# Patient Record
Sex: Female | Born: 1935 | Race: White | Hispanic: No | Marital: Single | Smoking: Former smoker
Health system: Southern US, Community
[De-identification: ages and names within clinical notes are randomized; demographics above are authoritative.]

## PROBLEM LIST (undated history)

## (undated) DIAGNOSIS — J439 Emphysema, unspecified: Secondary | ICD-10-CM

## (undated) DIAGNOSIS — R0989 Other specified symptoms and signs involving the circulatory and respiratory systems: Secondary | ICD-10-CM

## (undated) DIAGNOSIS — M1990 Unspecified osteoarthritis, unspecified site: Secondary | ICD-10-CM

## (undated) DIAGNOSIS — J45909 Unspecified asthma, uncomplicated: Secondary | ICD-10-CM

## (undated) DIAGNOSIS — R233 Spontaneous ecchymoses: Secondary | ICD-10-CM

## (undated) DIAGNOSIS — K279 Peptic ulcer, site unspecified, unspecified as acute or chronic, without hemorrhage or perforation: Secondary | ICD-10-CM

## (undated) DIAGNOSIS — J449 Chronic obstructive pulmonary disease, unspecified: Secondary | ICD-10-CM

## (undated) DIAGNOSIS — J349 Unspecified disorder of nose and nasal sinuses: Secondary | ICD-10-CM

## (undated) DIAGNOSIS — H9190 Unspecified hearing loss, unspecified ear: Secondary | ICD-10-CM

## (undated) DIAGNOSIS — Z87448 Personal history of other diseases of urinary system: Secondary | ICD-10-CM

## (undated) DIAGNOSIS — T8859XA Other complications of anesthesia, initial encounter: Secondary | ICD-10-CM

## (undated) DIAGNOSIS — T4145XA Adverse effect of unspecified anesthetic, initial encounter: Secondary | ICD-10-CM

## (undated) DIAGNOSIS — R238 Other skin changes: Secondary | ICD-10-CM

## (undated) DIAGNOSIS — K219 Gastro-esophageal reflux disease without esophagitis: Secondary | ICD-10-CM

## (undated) DIAGNOSIS — I872 Venous insufficiency (chronic) (peripheral): Secondary | ICD-10-CM

## (undated) DIAGNOSIS — H409 Unspecified glaucoma: Secondary | ICD-10-CM

## (undated) HISTORY — PX: OTHER SURGICAL HISTORY: SHX169

## (undated) HISTORY — DX: Unspecified osteoarthritis, unspecified site: M19.90

## (undated) HISTORY — PX: ABDOMINAL HYSTERECTOMY: SHX81

## (undated) HISTORY — PX: SURGERY OF LIP: SUR1315

## (undated) HISTORY — DX: Unspecified disorder of nose and nasal sinuses: J34.9

## (undated) HISTORY — DX: Personal history of other diseases of urinary system: Z87.448

## (undated) HISTORY — DX: Chronic obstructive pulmonary disease, unspecified: J44.9

## (undated) HISTORY — DX: Unspecified hearing loss, unspecified ear: H91.90

## (undated) HISTORY — DX: Spontaneous ecchymoses: R23.3

## (undated) HISTORY — DX: Emphysema, unspecified: J43.9

## (undated) HISTORY — DX: Unspecified asthma, uncomplicated: J45.909

## (undated) HISTORY — DX: Other skin changes: R23.8

## (undated) HISTORY — DX: Other specified symptoms and signs involving the circulatory and respiratory systems: R09.89

## (undated) HISTORY — DX: Unspecified glaucoma: H40.9

---

## 2011-01-11 ENCOUNTER — Ambulatory Visit: Payer: Self-pay | Admitting: Internal Medicine

## 2011-02-15 ENCOUNTER — Ambulatory Visit: Payer: Self-pay | Admitting: Internal Medicine

## 2012-02-26 ENCOUNTER — Ambulatory Visit: Payer: Self-pay | Admitting: Internal Medicine

## 2012-10-21 ENCOUNTER — Ambulatory Visit: Payer: Self-pay | Admitting: Medical

## 2013-06-19 ENCOUNTER — Ambulatory Visit: Payer: Self-pay | Admitting: Family Medicine

## 2014-01-07 ENCOUNTER — Ambulatory Visit: Payer: Self-pay | Admitting: Physician Assistant

## 2014-01-17 ENCOUNTER — Encounter: Payer: Self-pay | Admitting: Podiatry

## 2014-01-18 ENCOUNTER — Encounter: Payer: Self-pay | Admitting: Podiatry

## 2014-01-18 ENCOUNTER — Ambulatory Visit (INDEPENDENT_AMBULATORY_CARE_PROVIDER_SITE_OTHER): Payer: Medicare Other

## 2014-01-18 ENCOUNTER — Ambulatory Visit (INDEPENDENT_AMBULATORY_CARE_PROVIDER_SITE_OTHER): Payer: Medicare Other | Admitting: Podiatry

## 2014-01-18 VITALS — BP 117/61 | HR 76 | Resp 16 | Ht 61.0 in | Wt 112.0 lb

## 2014-01-18 DIAGNOSIS — M79609 Pain in unspecified limb: Secondary | ICD-10-CM

## 2014-01-18 DIAGNOSIS — M79673 Pain in unspecified foot: Secondary | ICD-10-CM

## 2014-01-18 DIAGNOSIS — M722 Plantar fascial fibromatosis: Secondary | ICD-10-CM

## 2014-01-18 DIAGNOSIS — D492 Neoplasm of unspecified behavior of bone, soft tissue, and skin: Secondary | ICD-10-CM

## 2014-01-18 MED ORDER — TRIAMCINOLONE ACETONIDE 10 MG/ML IJ SUSP
10.0000 mg | Freq: Once | INTRAMUSCULAR | Status: AC
  Administered 2014-01-18: 10 mg

## 2014-01-20 NOTE — Progress Notes (Signed)
Subjective:     Patient ID: Kimberly Villarreal, female   DOB: Aug 20, 1920, 80 y.o.   MRN: [id number]  Foot Pain   patient points to the bottom of the left foot stating that she's had a knot on the arch that has been present for over a month and has been quite sore. Feels it's a little bit better the last week but still tender when pressed   Review of Systems  All other systems reviewed and are negative.       Objective:   Physical Exam  Nursing note and vitals reviewed. Constitutional: She is oriented to person, place, and time.  Cardiovascular: Intact distal pulses.   Musculoskeletal: Normal range of motion.  Neurological: She is oriented to person, place, and time.  Skin: Skin is warm.   neurovascular status intact with muscle strength adequate and no equinus condition noted. Small mass noted in the mid arch area left that is palpable and is painful when pressed with no indication of deep extension or changes on the skin     Assessment:     Probable cyst or possible plantar fasciitis with inflammation and fibroma plantar aspect left heel    Plan:     H&P and x-ray reviewed. Patient had numerous questions and we reviewed these questions and today I did a careful injection from the lateral side to try to reduce inflammation and possibly shrink the small mass. Patient will also use warm compresses and return to me if it should not improve grow in size or stay painful

## 2014-02-01 ENCOUNTER — Ambulatory Visit (INDEPENDENT_AMBULATORY_CARE_PROVIDER_SITE_OTHER): Payer: Medicare Other | Admitting: Podiatry

## 2014-02-01 VITALS — BP 117/67 | HR 79 | Resp 16 | Ht 61.0 in | Wt 112.0 lb

## 2014-02-01 DIAGNOSIS — D492 Neoplasm of unspecified behavior of bone, soft tissue, and skin: Secondary | ICD-10-CM

## 2014-02-01 NOTE — Progress Notes (Signed)
Subjective:     Patient ID: Kimberly Villarreal, female   DOB: 1920-07-27, 80 y.o.   MRN: [id number]  HPI patient presents stating it is quite a bit smaller than it was I wanted you to check bad and also my nail  Review of Systems     Objective:   Physical Exam Neurovascular status unchanged with a small nodule plantar left arch which has shrunk in size and is no longer tender after injection of several weeks ago. Nail shows slight discoloration    Assessment:     Nodule that is shrinking and mycotic nail infection    Plan:     Reviewed conditions and advised on continued soaks and topical antifungal as needed. Reappoint her recheck

## 2014-10-23 DIAGNOSIS — M7050 Other bursitis of knee, unspecified knee: Secondary | ICD-10-CM | POA: Insufficient documentation

## 2015-03-14 ENCOUNTER — Ambulatory Visit
Admit: 2015-03-14 | Disposition: A | Discharge status: Home / Self Care | Payer: Self-pay | Attending: Family Medicine | Admitting: Family Medicine

## 2015-04-08 DIAGNOSIS — I872 Venous insufficiency (chronic) (peripheral): Secondary | ICD-10-CM | POA: Insufficient documentation

## 2015-04-08 DIAGNOSIS — I878 Other specified disorders of veins: Secondary | ICD-10-CM | POA: Insufficient documentation

## 2015-05-06 ENCOUNTER — Encounter: Payer: Self-pay | Admitting: Internal Medicine

## 2015-05-06 ENCOUNTER — Ambulatory Visit (INDEPENDENT_AMBULATORY_CARE_PROVIDER_SITE_OTHER): Payer: Medicare Other | Admitting: Internal Medicine

## 2015-05-06 VITALS — BP 124/64 | HR 70 | Temp 97.5°F | Ht 61.0 in | Wt 103.6 lb

## 2015-05-06 DIAGNOSIS — J432 Centrilobular emphysema: Secondary | ICD-10-CM

## 2015-05-06 DIAGNOSIS — J439 Emphysema, unspecified: Secondary | ICD-10-CM | POA: Insufficient documentation

## 2015-05-06 NOTE — Assessment & Plan Note (Addendum)
Patient overall is in very stable condition. I believe that had 2 bronchitis episodes in the last 8 months of didn't primarily due to close proximity with sick contacts, which includes her daughter. Her COPD overall is mild. Pulmonary function testing at this time is not recommended, I do not believe the patient will be compliant for the pulmonary function visit or following the appropriate procedures for an accurate pulmonary function test. In either case, she is managed well with just 2 puffs of albuterol in the mornings for the last several years.  Plan: -Continue with 2 puffs of albuterol in the mornings, may use albuterol as needed throughout the day for shortness of breath/cough/wheezing -Advised patient that to fill her prescription for omeprazole, this may assist in her chest discomfort and follow-up with her primary care physician

## 2015-05-06 NOTE — Patient Instructions (Signed)
Follow up in 6 months - continue with 2 puff ventolin in the morning as you have been doing - cont with exercise - avoid sick contact - see your PMD about your acid reflux

## 2015-05-06 NOTE — Progress Notes (Signed)
Date: 05/06/2015  MRN# [id number] Kimberly Villarreal 1920-08-28  Referring Physician: Dr. Windell Moment Kimberly Villarreal is a 81 y.o. old female seen in consultation for optimization of COPD  CC:  Chief Complaint  Patient presents with  . Advice Only    Referred for COPD f/u from Dr. Norman Clay @ [place]. Pt has sob/chest pressure on occasion. She has had bronchitis x2 since Nov 2015. CAT 9 MMRC 1    HPI:  Patient presents today for establish care visit of COPD/chest discomfort. Patient is a pleasant 81 year old female that has a past medical history of acid reflux, questionable COPD, seen in consultation for the above chief complaint. Patient states that in November 2015 in February 2016 she had 2 "bad" episodes of bronchitis which required Vicente Males biotics and inhalers each one lasting about a month. Today patient states that she doesn't have any shortness of breath or dyspnea on exertion, she also denies any other yearly bronchitis. Patient states that she is very active, she is still driving, she goes to dance classes and has regular exercise. She's been using Ventolin twice in the mornings for a number of years that has proven to be beneficial to her. She does have a history of dependent edema that resolves with foot elevation. Patient is a previous smoker half pack a day for 30 years she quit in the 1970s Patient. They lived in [place]. Patient was previously given Prilosec ascription, but did not fill it [place] did not think she had acid reflux disease and did not attribute some of the chest discomfort to this. PMHX:   Past Medical History  Diagnosis Date  . Osteoarthritis   . Glaucoma   . Hearing loss   . Sinus disorder   . Poor circulation   . History of bladder problems   . Asthma   . Bruises easily   . COPD (chronic obstructive pulmonary disease)   . Emphysema lung    Surgical Hx:  Past Surgical History  Procedure Laterality Date  . Abdominal hysterectomy     Family Hx:  No  family history on file. Social Hx:   History  Substance Use Topics  . Smoking status: Never Smoker   . Smokeless tobacco: Not on file  . Alcohol Use: No   Medication:   Current Outpatient Rx  Name  Route  Sig  Dispense  Refill  . albuterol (VENTOLIN HFA) 108 (90 BASE) MCG/ACT inhaler   Inhalation   Inhale 108 mcg into the lungs every 6 (six) hours as needed.         . brimonidine (ALPHAGAN) 0.2 % ophthalmic solution   Both Eyes   Place 1 drop into both eyes daily.         . carboxymethylcellulose (REFRESH PLUS) 0.5 % SOLN   Ophthalmic   Apply 1-2 drops to eye daily as needed.         . loratadine (CLARITIN) 10 MG tablet   Oral   Take 10 mg by mouth daily.         Marland Kitchen oxybutynin (DITROPAN) 5 MG tablet   Oral   Take 5 mg by mouth 2 (two) times daily.         Marland Kitchen triamcinolone cream (KENALOG) 0.1 %   Topical   Apply 0.1 g topically as needed.             Allergies:  Beta adrenergic blockers; Sulfa antibiotics; and Xalatan  Review of Systems: Gen:  Denies  fever, sweats,  chills HEENT: Denies blurred vision, double vision, ear pain, eye pain, hearing loss, nose bleeds, sore throat Cvc:  No dizziness, chest pain or heaviness Resp:   Intermittent shortness of breath and chest discomfort. Gi: Denies swallowing difficulty, stomach pain, nausea or vomiting, diarrhea, constipation, bowel incontinence Gu:  Denies bladder incontinence, burning urine Ext:   No Joint pain, stiffness or swelling Skin: No skin rash, easy bruising or bleeding or hives Endoc:  No polyuria, polydipsia , polyphagia or weight change Psych: No depression, insomnia or hallucinations  Other:  All other systems negative  Physical Examination:   VS: BP:124/64, Temp 97.5, Wt. 103.6 lbs, O2 Saturation - 90% on RA  General Appearance: No distress  Neuro:without focal findings, mental status, speech normal, alert and oriented, cranial nerves 2-12 intact, reflexes normal and symmetric, sensation  grossly normal  HEENT: PERRLA, EOM intact, no ptosis, no other lesions noticed; Mallampati 2 Pulmonary: normal breath sounds., diaphragmatic excursion normal.No wheezing, No rales;   Sputum Production:  None CardiovascularNormal S1,S2.  No m/r/g.  Abdominal aorta pulsation normal.    Abdomen: Benign, Soft, non-tender, No masses, hepatosplenomegaly, No lymphadenopathy Renal:  No costovertebral tenderness  GU:  No performed at this time. Endoc: No evident thyromegaly, no signs of acromegaly or Cushing features Skin:   warm, no rashes, no ecchymosis  Extremities: normal, no cyanosis, clubbing, warm with normal capillary refill. Other findings: Trace leg edema   Labs results:   Rad results: (The following images and results were reviewed by Dr. Stevenson Clinch). His x-ray November 2015 Indication: J44.1 Chronic obstructive pulmonary disease with (acute) exacerbation, 81 yo female with copd exacerbation needs evaluation for pulmonary infiltrates.  PA and lateral views.  Impression: 1. Normal cardiac silhouette. 2. Findings suggestive of emphysema. 3. Minimal apical pleural thickening. 4. No acute abnormalities in the lungs.      Assessment and Plan: 81 year old female past medical history of COPD, seen in consultation today for COPD optimization. COPD type A Patient overall is in very stable condition. I believe that had 2 bronchitis episodes in the last 8 months of didn't primarily due to close proximity with sick contacts, which includes her daughter. Her COPD overall is mild. Pulmonary function testing at this time is not recommended, I do not believe the patient will be compliant for the pulmonary function visit or following the appropriate procedures for an accurate pulmonary function test. In either case, she is managed well with just 2 puffs of albuterol in the mornings for the last several years.  Plan: -Continue with 2 puffs of albuterol in the mornings, may use albuterol as needed  throughout the day for shortness of breath/cough/wheezing -Advised patient that to fill her prescription for omeprazole, this may assist in her chest discomfort and follow-up with her primary care physician   MMRC=1 CAT = 8  Updated Medication List Outpatient Encounter Prescriptions as of 05/06/2015  Medication Sig  . albuterol (VENTOLIN HFA) 108 (90 BASE) MCG/ACT inhaler Inhale 108 mcg into the lungs every 6 (six) hours as needed.  . brimonidine (ALPHAGAN) 0.2 % ophthalmic solution Place 1 drop into both eyes daily.  . carboxymethylcellulose (REFRESH PLUS) 0.5 % SOLN Apply 1-2 drops to eye daily as needed.  . loratadine (CLARITIN) 10 MG tablet Take 10 mg by mouth daily.  Marland Kitchen oxybutynin (DITROPAN) 5 MG tablet Take 5 mg by mouth 2 (two) times daily.  Marland Kitchen triamcinolone cream (KENALOG) 0.1 % Apply 0.1 g topically as needed.  . [DISCONTINUED] albuterol (ACCUNEB) 1.25 MG/3ML nebulizer solution  Inhale 1.25 mg into the lungs every 6 (six) hours as needed.  . [DISCONTINUED] triamcinolone (NASACORT) 55 MCG/ACT AERO nasal inhaler Place 2 sprays into the nose daily.  . [DISCONTINUED] beclomethasone (QVAR) 80 MCG/ACT inhaler Inhale 1 puff into the lungs 2 (two) times daily.  . [DISCONTINUED] omeprazole (PRILOSEC) 40 MG capsule Take 40 mg by mouth daily.   No facility-administered encounter medications on file as of 05/06/2015.    Orders for this visit: No orders of the defined types were placed in this encounter.     Thank  you for the consultation and for allowing [facility] to assist in the care of your patient. Our recommendations are noted above.  Please contact us if we can be of further service.   Vilinda Boehringer, MD  Pulmonary and Critical Care Office Number: [phone]

## 2015-05-07 ENCOUNTER — Encounter: Payer: Self-pay | Admitting: Internal Medicine

## 2015-07-10 ENCOUNTER — Encounter: Payer: Self-pay | Admitting: Podiatry

## 2015-07-10 ENCOUNTER — Ambulatory Visit (INDEPENDENT_AMBULATORY_CARE_PROVIDER_SITE_OTHER): Payer: Medicare Other | Admitting: Podiatry

## 2015-07-10 DIAGNOSIS — L84 Corns and callosities: Secondary | ICD-10-CM | POA: Diagnosis not present

## 2015-07-10 DIAGNOSIS — B351 Tinea unguium: Secondary | ICD-10-CM

## 2015-07-10 NOTE — Progress Notes (Signed)
Patient ID: Kimberly Villarreal, female   DOB: 1920/01/22, 81 y.o.   MRN: [id number]  Subjective: 81 year old female presents the office today with concerns of the thick, discolored toenail left big toe which occasionally becomes uncomfortable although she denies any pain to the area. She states that the discomfort is very intermittent in nature. She denies any swelling or any redness or any drainage from the toenail. She states the toenail previously been removed in a group back in the same way. She was using the anti-fungal without any resolution. She also states that she has a place on the ball of her right foot which is new and on the side of her big toe. Denies any history of injury or trauma. No swelling or redness. No other complaints at this time. No acute changes.  Objective: AAO 3, NAD DP/PT pulses are palpable, CRT less than 3 seconds Protective sensation intact with Derrel Nip monofilament Left hallux toenail is hypertrophic, dystrophic, discolored, brittle, elongated. It is slightly loosened underlying nailbed distally however is firmly adhered proximally. There is no surrounding erythema, edema, drainage/purulence. There is no clinical signs of infection. No tenderness palpation along the nail this time. On the plantar aspect of the right foot there is hyperkeratotic lesion submetatarsal 2 and 3. Upon debridement no underlying ulceration, drainage or other signs of infection. There are small hyperkeratotic lesions medial hallux. Upon debridement no underlying ulceration drainage or other signs of infection. There is prominent metatarsal heads plantarly with atrophy of the fat pad. No other areas of tenderness. No open edema, erythema, increase in warmth bilaterally. There is no pain with calf compression, swelling, warmth, erythema.  Assessment: 81 year old female with onychodystrophy, likely onychomycosis left hallux toenail currently without any pain; porokeratosis  Plan: -Treatment  options discussed including all alternatives, risks, and complications -Left hallux nail sharply debrided without complication/bleeding. I discussed toenail removal. Discussed with her that as is done permanently the nail likely grow back in the same way. She wishes to hold off on nail removal this time. Continue to monitor for signs or symptoms of infection and directed to call the office immediately should any occur. -Hyperkeratotic lesion sharply debrided without consultation/bleeding. -Follow- up as needed. Call any questions, concerns, change in symptoms.  Celesta Gentile, DPM

## 2015-10-06 ENCOUNTER — Other Ambulatory Visit: Payer: Self-pay | Admitting: Gastroenterology

## 2015-10-06 DIAGNOSIS — Z8719 Personal history of other diseases of the digestive system: Secondary | ICD-10-CM

## 2015-10-06 DIAGNOSIS — R0789 Other chest pain: Secondary | ICD-10-CM

## 2015-10-13 ENCOUNTER — Ambulatory Visit
Admission: RE | Admit: 2015-10-13 | Discharge: 2015-10-13 | Disposition: A | Discharge status: Home / Self Care | Payer: Medicare Other | Source: Ambulatory Visit | Attending: Gastroenterology | Admitting: Gastroenterology

## 2015-10-13 DIAGNOSIS — K219 Gastro-esophageal reflux disease without esophagitis: Secondary | ICD-10-CM | POA: Diagnosis not present

## 2015-10-13 DIAGNOSIS — K224 Dyskinesia of esophagus: Secondary | ICD-10-CM | POA: Insufficient documentation

## 2015-10-13 DIAGNOSIS — R0789 Other chest pain: Secondary | ICD-10-CM | POA: Diagnosis not present

## 2015-10-13 DIAGNOSIS — Z8719 Personal history of other diseases of the digestive system: Secondary | ICD-10-CM

## 2015-12-22 ENCOUNTER — Ambulatory Visit (INDEPENDENT_AMBULATORY_CARE_PROVIDER_SITE_OTHER): Payer: Medicare Other | Admitting: Internal Medicine

## 2015-12-22 ENCOUNTER — Encounter: Payer: Self-pay | Admitting: Internal Medicine

## 2015-12-22 ENCOUNTER — Ambulatory Visit
Admission: RE | Admit: 2015-12-22 | Discharge: 2015-12-22 | Disposition: A | Discharge status: Home / Self Care | Payer: Medicare Other | Source: Ambulatory Visit | Attending: Internal Medicine | Admitting: Internal Medicine

## 2015-12-22 VITALS — BP 138/74 | HR 85 | Ht 61.0 in | Wt 106.0 lb

## 2015-12-22 DIAGNOSIS — R05 Cough: Secondary | ICD-10-CM

## 2015-12-22 DIAGNOSIS — J432 Centrilobular emphysema: Secondary | ICD-10-CM | POA: Diagnosis not present

## 2015-12-22 DIAGNOSIS — R053 Chronic cough: Secondary | ICD-10-CM

## 2015-12-22 DIAGNOSIS — J449 Chronic obstructive pulmonary disease, unspecified: Secondary | ICD-10-CM | POA: Insufficient documentation

## 2015-12-22 NOTE — Patient Instructions (Signed)
Follow up with Dr. Stevenson Clinch in: 3 months - cont with your albuterol as directed - cont with your reflux pills as directed by your primary care physician - we will check a 2 view chest xray, given you chronic cough, within the next week.  We will call you with the results of this xray. - avoid allergens and sick contacts.

## 2015-12-22 NOTE — Assessment & Plan Note (Signed)
Patient overall is in very stable condition. Her COPD overall is mild. Pulmonary function testing at this time is not recommended, I do not believe the patient will be compliant for the pulmonary function visit or following the appropriate procedures for an accurate pulmonary function test. In either case, she is managed well with just 2 puffs of albuterol in the mornings for the last several years.  Plan: -Continue with 2 puffs of albuterol in the mornings, may use albuterol as needed throughout the day for shortness of breath/cough/wheezing - check 2 view cxr for chronic cough over the last 2 months.

## 2015-12-22 NOTE — Assessment & Plan Note (Signed)
Occuring over the last 2 months  Mostly likely related to her GERD  Plan - cont with PPI as directed by PMD - 2 view CXR in the next week.

## 2015-12-22 NOTE — Progress Notes (Signed)
Date: 12/22/2015  MRN# [id number] Kimberly Villarreal 11-05-1920  Referring Physician: Dr. Windell Moment Kimberly Villarreal is a 82 y.o. old female seen in consultation for optimization of COPD  CC:  Chief Complaint  Patient presents with  . Follow-up    pt. states breathing is baseline. dry cough X69mo.  occ. SOB. occ. chest pain/tightness. denies wheezing .    HPI:  Patient presents today for establish care visit of COPD/chest discomfort. Patient is a pleasant 82 year old female that has a past medical history of acid reflux, questionable COPD, seen in consultation for the above chief complaint. Patient states that in November 2015 in February 2016 she had 2 "bad" episodes of bronchitis which required Vicente Males biotics and inhalers each one lasting about a month. Today patient states that she doesn't have any shortness of breath or dyspnea on exertion, she also denies any other yearly bronchitis. Patient states that she is very active, she is still driving, she goes to dance classes and has regular exercise. She's been using Ventolin twice in the mornings for a number of years that has proven to be beneficial to her. She does have a history of dependent edema that resolves with foot elevation. Patient is a previous smoker half pack a day for 30 years she quit in the 1970s Patient. They lived in [place]. Patient was previously given Prilosec ascription, but did not fill it [place] did not think she had acid reflux disease and did not attribute some of the chest discomfort to this. Plan - albuterol in the AM and PRN. COPD A  Events since last clinic visit: Patient presents today for a follow-up visit of her COPD. Overall she states she is doing well, no worsening shortness of breath, or dyspnea on exertion. She states over the past 2 months she's had a productive cough, with whitish sputum mostly in the mornings. She had a barium swallow done in the last 2 months that showed acid reflux disease/esophagitis,  her primary care physician has placed her on twice a day PPI over the last 1 week.   PMHX:   Past Medical History  Diagnosis Date  . Osteoarthritis   . Glaucoma   . Hearing loss   . Sinus disorder   . Poor circulation   . History of bladder problems   . Asthma   . Bruises easily   . COPD (chronic obstructive pulmonary disease) ([place])   . Emphysema lung ([place])    Surgical Hx:  Past Surgical History  Procedure Laterality Date  . Abdominal hysterectomy     Family Hx:  Family History  Problem Relation Age of Onset  . Coronary artery disease Mother   . Coronary artery disease Father   . Stroke Father   . Stroke Sister   . Stroke Daughter   . Anxiety disorder Daughter   . Depression Daughter   . Skin cancer Daughter    Social Hx:   Social History  Substance Use Topics  . Smoking status: Never Smoker   . Smokeless tobacco: Never Used  . Alcohol Use: No   Medication:   Current Outpatient Rx  Name  Route  Sig  Dispense  Refill  . acetic acid-hydrocortisone (VOSOL-HC) otic solution      instill 4 drops into each ear four times a day if needed for ITCHY EARS      0   . brimonidine (ALPHAGAN) 0.2 % ophthalmic solution   Both Eyes   Place 1 drop into both eyes daily.         Marland Kitchen  Brinzolamide-Brimonidine (SIMBRINZA) 1-0.2 % SUSP      instill 1 drop into right eye twice a day         . carboxymethylcellulose (REFRESH PLUS) 0.5 % SOLN   Ophthalmic   Apply 1-2 drops to eye daily as needed.         . loratadine (CLARITIN) 10 MG tablet   Oral   Take 10 mg by mouth daily.         Marland Kitchen oxybutynin (DITROPAN) 5 MG tablet   Oral   Take 5 mg by mouth 2 (two) times daily.         Marland Kitchen triamcinolone cream (KENALOG) 0.1 %   Topical   Apply 0.1 g topically as needed.         Marland Kitchen EXPIRED: albuterol (VENTOLIN HFA) 108 (90 BASE) MCG/ACT inhaler   Inhalation   Inhale 108 mcg into the lungs every 6 (six) hours as needed.         Marland Kitchen omeprazole (PRILOSEC) 40 MG capsule    Oral   Take 40 mg by mouth 2 (two) times daily.      0       Allergies:  Beta adrenergic blockers; Sulfa antibiotics; and Xalatan  Review of Systems: Gen:  Denies  fever, sweats, chills HEENT: Denies blurred vision, double vision, ear pain, eye pain, hearing loss, nose bleeds, sore throat Cvc:  No dizziness, chest pain or heaviness Resp:   Intermittent shortness of breath and chest discomfort (chronic). Cough with whitish productive sputum in the AM.  Gi: Denies swallowing difficulty, stomach pain, nausea or vomiting, diarrhea, constipation, bowel incontinence Gu:  Denies bladder incontinence, burning urine Ext:   No Joint pain, stiffness or swelling Skin: No skin rash, easy bruising or bleeding or hives Endoc:  No polyuria, polydipsia , polyphagia or weight change Psych: No depression, insomnia or hallucinations  Other:  All other systems negative  Physical Examination:   VS: BP:124/64, Temp 97.5, Wt. 103.6 lbs, O2 Saturation - 90% on RA  General Appearance: No distress  Neuro:without focal findings, mental status, speech normal, alert and oriented, cranial nerves 2-12 intact, reflexes normal and symmetric, sensation grossly normal  HEENT: PERRLA, EOM intact, no ptosis, no other lesions noticed; Mallampati 2 Pulmonary: normal breath sounds., diaphragmatic excursion normal.No wheezing, No rales;   Sputum Production:  None CardiovascularNormal S1,S2.  No m/r/g.  Abdominal aorta pulsation normal.    Abdomen: Benign, Soft, non-tender, No masses, hepatosplenomegaly, No lymphadenopathy Renal:  No costovertebral tenderness  GU:  No performed at this time. Endoc: No evident thyromegaly, no signs of acromegaly or Cushing features Skin:   warm, no rashes, no ecchymosis  Extremities: normal, no cyanosis, clubbing, warm with normal capillary refill. Other findings: Trace leg edema   Labs results:   Rad results: (The following images and results were reviewed by Dr. Stevenson Clinch). His x-ray  November 2015 Indication: J44.1 Chronic obstructive pulmonary disease with (acute) exacerbation, 82 yo female with copd exacerbation needs evaluation for pulmonary infiltrates.  PA and lateral views.  Impression: 1. Normal cardiac silhouette. 2. Findings suggestive of emphysema. 3. Minimal apical pleural thickening. 4. No acute abnormalities in the lungs.  Barium swallow study 09/2015 FINDINGS:  The patient swallows without difficulty. There is diminished primary  peristalsis with intermittent tertiary contractions. There is no  appreciable hiatal hernia. There is reflux of a full column of  barium to the upper esophagus which is slowed empty. There is no  esophageal stricture, mass, or ulceration. There are  thickened folds  in the distal third of the esophagus consistent with a degree of  esophagitis distally. Pharynx appears normal. The 13 mm barium  tablet passed freely through the esophagus into the stomach without  appreciable hesitation.  IMPRESSION:  Esophageal dysmotility with liquid material. Distal esophagitis. No  frank mass or ulceration. There is diffuse reflux without hiatal  hernia or stricture.    Assessment and Plan: 82 year old female past medical history of COPD, seen in consultation today for COPD optimization. Chronic cough Occuring over the last 2 months  Mostly likely related to her GERD  Plan - cont with PPI as directed by PMD - 2 view CXR in the next week.   COPD type A Patient overall is in very stable condition. Her COPD overall is mild. Pulmonary function testing at this time is not recommended, I do not believe the patient will be compliant for the pulmonary function visit or following the appropriate procedures for an accurate pulmonary function test. In either case, she is managed well with just 2 puffs of albuterol in the mornings for the last several years.  Plan: -Continue with 2 puffs of albuterol in the mornings, may use albuterol as  needed throughout the day for shortness of breath/cough/wheezing - check 2 view cxr for chronic cough over the last 2 months.        Updated Medication List Outpatient Encounter Prescriptions as of 12/22/2015  Medication Sig  . acetic acid-hydrocortisone (VOSOL-HC) otic solution instill 4 drops into each ear four times a day if needed for ITCHY EARS  . brimonidine (ALPHAGAN) 0.2 % ophthalmic solution Place 1 drop into both eyes daily.  . Brinzolamide-Brimonidine (SIMBRINZA) 1-0.2 % SUSP instill 1 drop into right eye twice a day  . carboxymethylcellulose (REFRESH PLUS) 0.5 % SOLN Apply 1-2 drops to eye daily as needed.  . loratadine (CLARITIN) 10 MG tablet Take 10 mg by mouth daily.  Marland Kitchen oxybutynin (DITROPAN) 5 MG tablet Take 5 mg by mouth 2 (two) times daily.  Marland Kitchen triamcinolone cream (KENALOG) 0.1 % Apply 0.1 g topically as needed.  Marland Kitchen albuterol (VENTOLIN HFA) 108 (90 BASE) MCG/ACT inhaler Inhale 108 mcg into the lungs every 6 (six) hours as needed.  Marland Kitchen omeprazole (PRILOSEC) 40 MG capsule Take 40 mg by mouth 2 (two) times daily.   No facility-administered encounter medications on file as of 12/22/2015.    Orders for this visit: Orders Placed This Encounter  Procedures  . DG Chest 2 View    Standing Status: Future     Number of Occurrences: 1     Standing Expiration Date: 02/20/2017    Order Specific Question:  Reason for Exam (SYMPTOM  OR DIAGNOSIS REQUIRED)    Answer:  chronic cough    Order Specific Question:  Preferred imaging location?    Answer:  [facility]     Thank  you for the consultation and for allowing [facility] to assist in the care of your patient. Our recommendations are noted above.  Please contact us if we can be of further service.   Vilinda Boehringer, MD [facility] Office Number: [phone]

## 2015-12-24 ENCOUNTER — Telehealth: Payer: Self-pay

## 2015-12-24 NOTE — Telephone Encounter (Signed)
Pt informed of CXR results. Nothing further needed. 

## 2015-12-24 NOTE — Telephone Encounter (Signed)
-----   Message from Vilinda Boehringer, MD sent at 12/22/2015  3:42 PM EST ----- Regarding: chest xray results Please let patient know that her chest xray does not show any pneumonia or infection.  Thank you

## 2016-03-08 ENCOUNTER — Ambulatory Visit
Admission: RE | Admit: 2016-03-08 | Discharge: 2016-03-08 | Disposition: A | Discharge status: Home / Self Care | Payer: Medicare Other | Source: Ambulatory Visit | Attending: Gastroenterology | Admitting: Gastroenterology

## 2016-03-08 ENCOUNTER — Encounter
Admission: RE | Disposition: A | Discharge status: Home / Self Care | Payer: Self-pay | Source: Ambulatory Visit | Attending: Gastroenterology

## 2016-03-08 ENCOUNTER — Ambulatory Visit: Payer: Medicare Other | Admitting: Anesthesiology

## 2016-03-08 DIAGNOSIS — M810 Age-related osteoporosis without current pathological fracture: Secondary | ICD-10-CM | POA: Diagnosis not present

## 2016-03-08 DIAGNOSIS — K210 Gastro-esophageal reflux disease with esophagitis: Secondary | ICD-10-CM | POA: Diagnosis not present

## 2016-03-08 DIAGNOSIS — I739 Peripheral vascular disease, unspecified: Secondary | ICD-10-CM | POA: Diagnosis not present

## 2016-03-08 DIAGNOSIS — K219 Gastro-esophageal reflux disease without esophagitis: Secondary | ICD-10-CM | POA: Insufficient documentation

## 2016-03-08 DIAGNOSIS — Z90710 Acquired absence of both cervix and uterus: Secondary | ICD-10-CM | POA: Diagnosis not present

## 2016-03-08 DIAGNOSIS — Z882 Allergy status to sulfonamides status: Secondary | ICD-10-CM | POA: Diagnosis not present

## 2016-03-08 DIAGNOSIS — J439 Emphysema, unspecified: Secondary | ICD-10-CM | POA: Insufficient documentation

## 2016-03-08 DIAGNOSIS — M1990 Unspecified osteoarthritis, unspecified site: Secondary | ICD-10-CM | POA: Insufficient documentation

## 2016-03-08 DIAGNOSIS — Z79899 Other long term (current) drug therapy: Secondary | ICD-10-CM | POA: Diagnosis not present

## 2016-03-08 DIAGNOSIS — R1310 Dysphagia, unspecified: Secondary | ICD-10-CM | POA: Diagnosis present

## 2016-03-08 DIAGNOSIS — Z888 Allergy status to other drugs, medicaments and biological substances status: Secondary | ICD-10-CM | POA: Insufficient documentation

## 2016-03-08 DIAGNOSIS — Z87891 Personal history of nicotine dependence: Secondary | ICD-10-CM | POA: Diagnosis not present

## 2016-03-08 HISTORY — DX: Venous insufficiency (chronic) (peripheral): I87.2

## 2016-03-08 HISTORY — DX: Other complications of anesthesia, initial encounter: T88.59XA

## 2016-03-08 HISTORY — DX: Peptic ulcer, site unspecified, unspecified as acute or chronic, without hemorrhage or perforation: K27.9

## 2016-03-08 HISTORY — DX: Gastro-esophageal reflux disease without esophagitis: K21.9

## 2016-03-08 HISTORY — PX: ESOPHAGOGASTRODUODENOSCOPY (EGD) WITH PROPOFOL: SHX5813

## 2016-03-08 HISTORY — DX: Adverse effect of unspecified anesthetic, initial encounter: T41.45XA

## 2016-03-08 SURGERY — ESOPHAGOGASTRODUODENOSCOPY (EGD) WITH PROPOFOL
Anesthesia: General

## 2016-03-08 MED ORDER — SODIUM CHLORIDE 0.9 % IV SOLN
INTRAVENOUS | Status: DC
  Administered 2016-03-08: 11:00:00 via INTRAVENOUS

## 2016-03-08 MED ORDER — LIDOCAINE HCL (CARDIAC) 20 MG/ML IV SOLN
INTRAVENOUS | Status: DC | PRN
  Administered 2016-03-08: 50 mg via INTRAVENOUS

## 2016-03-08 MED ORDER — PHENYLEPHRINE HCL 10 MG/ML IJ SOLN
INTRAMUSCULAR | Status: DC | PRN
  Administered 2016-03-08: 50 ug via INTRAVENOUS

## 2016-03-08 MED ORDER — PROPOFOL 10 MG/ML IV BOLUS
INTRAVENOUS | Status: DC | PRN
  Administered 2016-03-08: 20 mg via INTRAVENOUS

## 2016-03-08 MED ORDER — PROPOFOL 500 MG/50ML IV EMUL
INTRAVENOUS | Status: DC | PRN
  Administered 2016-03-08: 60 ug/kg/min via INTRAVENOUS

## 2016-03-08 NOTE — Transfer of Care (Signed)
Immediate Anesthesia Transfer of Care Note  Patient: Kimberly Villarreal  Procedure(s) Performed: Procedure(s): ESOPHAGOGASTRODUODENOSCOPY (EGD) WITH PROPOFOL (N/A)  Patient Location: PACU  Anesthesia Type:General  Level of Consciousness: awake, alert  and oriented  Airway & Oxygen Therapy: Patient Spontanous Breathing and Patient connected to nasal cannula oxygen  Post-op Assessment: Report given to RN and Post -op Vital signs reviewed and stable  Post vital signs: Reviewed and stable  Last Vitals:  Filed Vitals:   03/08/16 0959 03/08/16 1055  BP: 100/58 86/69  Pulse: 72 59  Temp: 36.5 C 36.4 C  Resp: 16 18    Complications: No apparent anesthesia complications

## 2016-03-08 NOTE — Anesthesia Preprocedure Evaluation (Signed)
Anesthesia Evaluation  Patient identified by MRN, date of birth, ID band Patient awake    Reviewed: Allergy & Precautions, H&P , NPO status , Patient's Chart, lab work & pertinent test results, reviewed documented beta blocker date and time   History of Anesthesia Complications (+) history of anesthetic complications  Airway Mallampati: II   Neck ROM: full    Dental  (+) Upper Dentures, Lower Dentures   Pulmonary neg pulmonary ROS, neg shortness of breath, asthma , COPD, former smoker,    Pulmonary exam normal        Cardiovascular + Peripheral Vascular Disease  negative cardio ROS Normal cardiovascular exam Rhythm:regular Rate:Normal     Neuro/Psych negative neurological ROS  negative psych ROS   GI/Hepatic negative GI ROS, Neg liver ROS, PUD, GERD  ,  Endo/Other  negative endocrine ROS  Renal/GU negative Renal ROS  negative genitourinary   Musculoskeletal   Abdominal   Peds  Hematology negative hematology ROS (+)   Anesthesia Other Findings Past Medical History:   Osteoarthritis                                               Glaucoma                                                     Hearing loss                                                 Sinus disorder                                               Poor circulation                                             History of bladder problems                                  Asthma                                                       Bruises easily                                               COPD (chronic obstructive pulmonary disease) (*              Emphysema lung ([facility])  GERD (gastroesophageal reflux disease)                       PUD (peptic ulcer disease)                                   Venous stasis dermatitis                                     Complication of anesthesia                                 Past  Surgical History:   ABDOMINAL HYSTERECTOMY                                        SURGERY OF LIP                                                teeth pulled                                                BMI    Body Mass Index   20.03 kg/m 2     Reproductive/Obstetrics                             Anesthesia Physical Anesthesia Plan  ASA: III  Anesthesia Plan: General   Post-op Pain Management:    Induction:   Airway Management Planned:   Additional Equipment:   Intra-op Plan:   Post-operative Plan:   Informed Consent: I have reviewed the patients History and Physical, chart, labs and discussed the procedure including the risks, benefits and alternatives for the proposed anesthesia with the patient or authorized representative who has indicated his/her understanding and acceptance.   Dental Advisory Given  Plan Discussed with: CRNA  Anesthesia Plan Comments: (Pt reports a previous visual problem following lip surgery with Propofol, versed and robinol.  This resolved sponttaneously and she accepts risks of propofol.  JA)        Anesthesia Quick Evaluation

## 2016-03-08 NOTE — H&P (Signed)
Primary Care Physician:  Chrisandra Carota, MD  Pre-Procedure History & Physical: HPI:  Kimberly Villarreal is a 82 y.o. female is here for an endoscopy.   Past Medical History  Diagnosis Date  . Osteoarthritis   . Glaucoma   . Hearing loss   . Sinus disorder   . Poor circulation   . History of bladder problems   . Asthma   . Bruises easily   . COPD (chronic obstructive pulmonary disease) ([place])   . Emphysema lung ([place])   . GERD (gastroesophageal reflux disease)   . PUD (peptic ulcer disease)   . Venous stasis dermatitis   . Complication of anesthesia     Past Surgical History  Procedure Laterality Date  . Abdominal hysterectomy    . Surgery of lip    . Teeth pulled      Prior to Admission medications   Medication Sig Start Date End Date Taking? Authorizing Provider  oxybutynin (DITROPAN) 5 MG tablet Take 5 mg by mouth 2 (two) times daily. 03/27/15  Yes Historical Provider, MD  acetic acid-hydrocortisone (VOSOL-HC) otic solution instill 4 drops into each ear four times a day if needed for ITCHY EARS 06/09/15   Historical Provider, MD  albuterol (VENTOLIN HFA) 108 (90 BASE) MCG/ACT inhaler Inhale 108 mcg into the lungs every 6 (six) hours as needed. 10/14/14 10/10/15  Historical Provider, MD  brimonidine (ALPHAGAN) 0.2 % ophthalmic solution Place 1 drop into both eyes daily. 09/09/14   Historical Provider, MD  Brinzolamide-Brimonidine (SIMBRINZA) 1-0.2 % SUSP instill 1 drop into right eye twice a day 05/21/15   Historical Provider, MD  carboxymethylcellulose (REFRESH PLUS) 0.5 % SOLN Apply 1-2 drops to eye daily as needed.    Historical Provider, MD  loratadine (CLARITIN) 10 MG tablet Take 10 mg by mouth daily.    Historical Provider, MD  omeprazole (PRILOSEC) 40 MG capsule Take 40 mg by mouth 2 (two) times daily. 12/15/15   Historical Provider, MD  triamcinolone cream (KENALOG) 0.1 % Apply 0.1 g topically as needed. 11/04/14   Historical Provider, MD    Allergies as of 02/20/2016 -  Review Complete 12/22/2015  Allergen Reaction Noted  . Beta adrenergic blockers Other (See Comments) 01/18/2014  . Sulfa antibiotics Other (See Comments) 01/17/2014  . Xalatan [latanoprost] Other (See Comments) 01/18/2014    Family History  Problem Relation Age of Onset  . Coronary artery disease Mother   . Coronary artery disease Father   . Stroke Father   . Stroke Sister   . Stroke Daughter   . Anxiety disorder Daughter   . Depression Daughter   . Skin cancer Daughter     Social History   Social History  . Marital Status: Single    Spouse Name: N/A  . Number of Children: N/A  . Years of Education: N/A   Occupational History  . Not on file.   Social History Main Topics  . Smoking status: Former Research scientist (life sciences)  . Smokeless tobacco: Never Used  . Alcohol Use: No  . Drug Use: No  . Sexual Activity: Not on file   Other Topics Concern  . Not on file   Social History Narrative     Physical Exam: BP 100/58 mmHg  Pulse 72  Temp(Src) 97.7 F (36.5 C) (Tympanic)  Resp 16  Ht 5\' 1"  (1.549 m)  Wt 48.081 kg (106 lb)  BMI 20.04 kg/m2  SpO2 100% General:   Alert,  pleasant and cooperative in NAD Head:  Normocephalic and  atraumatic. Neck:  Supple; no masses or thyromegaly. Lungs:  Clear throughout to auscultation.    Heart:  Regular rate and rhythm. Abdomen:  Soft, nontender and nondistended. Normal bowel sounds, without guarding, and without rebound.   Neurologic:  Alert and  oriented x4;  grossly normal neurologically.  Impression/Plan: Sonna Vandergriff is here for an endoscopy to be performed for atypical c/p, GERD, dysphagia,  Abnormal barium swallow  Risks, benefits, limitations, and alternatives regarding  endoscopy have been reviewed with the patient.  Questions have been answered.  All parties agreeable.   Josefine Class, MD  03/08/2016, 10:29 AM

## 2016-03-08 NOTE — Discharge Instructions (Signed)

## 2016-03-08 NOTE — Anesthesia Postprocedure Evaluation (Signed)
Anesthesia Post Note  Patient: Kimberly Villarreal  Procedure(s) Performed: Procedure(s) (LRB): ESOPHAGOGASTRODUODENOSCOPY (EGD) WITH PROPOFOL (N/A)  Patient location during evaluation: PACU Anesthesia Type: General Level of consciousness: awake and alert Pain management: pain level controlled Vital Signs Assessment: post-procedure vital signs reviewed and stable Respiratory status: spontaneous breathing, nonlabored ventilation, respiratory function stable and patient connected to nasal cannula oxygen Cardiovascular status: blood pressure returned to baseline and stable Postop Assessment: no signs of nausea or vomiting Anesthetic complications: no    Last Vitals:  Filed Vitals:   03/08/16 1115 03/08/16 1125  BP: 100/50 107/52  Pulse: 59 56  Temp:    Resp: 19 21    Last Pain: There were no vitals filed for this visit.               Molli Barrows

## 2016-03-08 NOTE — Op Note (Signed)
[facility] Gastroenterology Patient Name: Kimberly Villarreal Procedure Date: 03/08/2016 10:33 AM MRN: [id number] Account #: [id number] Date of Birth: [birth year] Admit Type: Outpatient Age: 82 Room: [facility] ENDO ROOM 2 Gender: Female Note Status: Finalized Procedure:            Upper GI endoscopy Indications:          Dysphagia, , Heartburn, Suspected esophageal reflux,                        Abnormal UGI series(distal esophageal thickening),                        Chest pain (non cardiac) Patient Profile:      This is a 82 year old female. Providers:            Gerrit Heck. Rayann Heman, MD Referring MD:         Bo Mcclintock. Vickki Muff (Referring MD) Medicines:            Propofol per Anesthesia Complications:        No immediate complications. Procedure:            Pre-Anesthesia Assessment:                       - Prior to the procedure, a History and Physical was                        performed, and patient medications, allergies and                        sensitivities were reviewed. The patient's tolerance of                        previous anesthesia was reviewed.                       After obtaining informed consent, the endoscope was                        passed under direct vision. Throughout the procedure,                        the patient's blood pressure, pulse, and oxygen                        saturations were monitored continuously. The                        Colonoscope was introduced through the mouth, and                        advanced to the second part of duodenum. The upper GI                        endoscopy was accomplished without difficulty. The                        patient tolerated the procedure well. Findings:      LA Grade A (one or more mucosal breaks less than 5 mm, not extending       between tops of 2 mucosal folds) esophagitis was found at  the       gastroesophageal junction. Biopsies were taken with a cold forceps for       histology.      The  stomach was normal.      The examined duodenum was normal. Impression:           - LA Grade A reflux esophagitis. Biopsied.                       - Normal stomach.                       - Normal examined duodenum. Recommendation:       - Observe patient in GI recovery unit.                       - Resume regular diet.                       - Continue present medications.                       - Decrease protonix to 40 mg daily.                       - Await pathology results.                       - The findings and recommendations were discussed with                        the patient.                       - The findings and recommendations were discussed with                        the patient's family. Procedure Code(s):    --- Professional ---                       [phone], Esophagogastroduodenoscopy, flexible, transoral;                        with biopsy, single or multiple Diagnosis Code(s):    --- Professional ---                       K21.0, Gastro-esophageal reflux disease with esophagitis                       R13.10, Dysphagia, unspecified                       R12, Heartburn                       R07.89, Other chest pain                       R93.3, Abnormal findings on diagnostic imaging of other                        parts of digestive tract CPT copyright 2016 [employer]. All rights reserved. The codes documented in this report are preliminary and upon coder review may  be revised  to meet current compliance requirements. Mellody Life, MD 03/08/2016 10:55:01 AM This report has been signed electronically. Number of Addenda: 0 Note Initiated On: 03/08/2016 10:33 AM      [facility]

## 2016-03-10 LAB — SURGICAL PATHOLOGY

## 2016-03-11 ENCOUNTER — Other Ambulatory Visit: Payer: Self-pay | Admitting: Family Medicine

## 2016-03-11 DIAGNOSIS — Z780 Asymptomatic menopausal state: Secondary | ICD-10-CM

## 2016-03-19 ENCOUNTER — Ambulatory Visit: Payer: Medicare Other | Admitting: Internal Medicine

## 2017-03-25 ENCOUNTER — Ambulatory Visit (INDEPENDENT_AMBULATORY_CARE_PROVIDER_SITE_OTHER): Payer: Medicare Other | Admitting: Vascular Surgery

## 2017-05-27 ENCOUNTER — Ambulatory Visit
Admission: EM | Admit: 2017-05-27 | Discharge: 2017-05-27 | Disposition: A | Discharge status: Home / Self Care | Payer: Medicare Other | Attending: Family Medicine | Admitting: Family Medicine

## 2017-05-27 DIAGNOSIS — Z823 Family history of stroke: Secondary | ICD-10-CM | POA: Diagnosis not present

## 2017-05-27 DIAGNOSIS — R070 Pain in throat: Secondary | ICD-10-CM | POA: Diagnosis present

## 2017-05-27 DIAGNOSIS — H9190 Unspecified hearing loss, unspecified ear: Secondary | ICD-10-CM | POA: Diagnosis not present

## 2017-05-27 DIAGNOSIS — Z888 Allergy status to other drugs, medicaments and biological substances status: Secondary | ICD-10-CM | POA: Diagnosis not present

## 2017-05-27 DIAGNOSIS — H409 Unspecified glaucoma: Secondary | ICD-10-CM | POA: Diagnosis not present

## 2017-05-27 DIAGNOSIS — Z79899 Other long term (current) drug therapy: Secondary | ICD-10-CM | POA: Insufficient documentation

## 2017-05-27 DIAGNOSIS — K219 Gastro-esophageal reflux disease without esophagitis: Secondary | ICD-10-CM | POA: Insufficient documentation

## 2017-05-27 DIAGNOSIS — Z8249 Family history of ischemic heart disease and other diseases of the circulatory system: Secondary | ICD-10-CM | POA: Insufficient documentation

## 2017-05-27 DIAGNOSIS — Z882 Allergy status to sulfonamides status: Secondary | ICD-10-CM | POA: Insufficient documentation

## 2017-05-27 DIAGNOSIS — Z87891 Personal history of nicotine dependence: Secondary | ICD-10-CM | POA: Diagnosis not present

## 2017-05-27 DIAGNOSIS — J439 Emphysema, unspecified: Secondary | ICD-10-CM | POA: Diagnosis not present

## 2017-05-27 DIAGNOSIS — Z818 Family history of other mental and behavioral disorders: Secondary | ICD-10-CM | POA: Insufficient documentation

## 2017-05-27 DIAGNOSIS — Z8711 Personal history of peptic ulcer disease: Secondary | ICD-10-CM | POA: Diagnosis not present

## 2017-05-27 DIAGNOSIS — I878 Other specified disorders of veins: Secondary | ICD-10-CM | POA: Diagnosis not present

## 2017-05-27 DIAGNOSIS — J020 Streptococcal pharyngitis: Secondary | ICD-10-CM | POA: Diagnosis not present

## 2017-05-27 DIAGNOSIS — B370 Candidal stomatitis: Secondary | ICD-10-CM

## 2017-05-27 DIAGNOSIS — M1990 Unspecified osteoarthritis, unspecified site: Secondary | ICD-10-CM | POA: Insufficient documentation

## 2017-05-27 DIAGNOSIS — Z90710 Acquired absence of both cervix and uterus: Secondary | ICD-10-CM | POA: Diagnosis not present

## 2017-05-27 LAB — RAPID STREP SCREEN (MED CTR MEBANE ONLY): STREPTOCOCCUS, GROUP A SCREEN (DIRECT): POSITIVE — AB

## 2017-05-27 MED ORDER — PENICILLIN G BENZATHINE 1200000 UNIT/2ML IM SUSP
1.2000 10*6.[IU] | Freq: Once | INTRAMUSCULAR | Status: AC
  Administered 2017-05-27: 1.2 10*6.[IU] via INTRAMUSCULAR

## 2017-05-27 MED ORDER — CLOTRIMAZOLE 10 MG MT TROC: 10.0000 mg | Freq: Every day | OROMUCOSAL | 0 refills | Status: AC

## 2017-05-27 NOTE — ED Provider Notes (Signed)
[facility]    CSN: [id number] Arrival date & time: 05/27/17  0951     History   Chief Complaint Chief Complaint  Patient presents with  . Oral Pain    HPI Kimberly Villarreal is a 83 y.o. female.   Patient is here because of irritation of her mouth. According to her daughter she started having irritation of the mouth about a week ago. They saw her PCP several days ago and was given a mouthwash which is make things worse according to the daughter. She reports that she does not smoke she does have a history of COPD and emphysema hearing loss and history of bladder problems. She denies being on antibiotics lately. She's had abdominal hysterectomy esophagogastroduodenoscopy and surgery for lip. She reports that this been bleeding of the gum as well and lesions in the gum as well as the mouth. She is allergic to beta blockers, sulfa antibiotics and Xalatan. No pertinent family medical history relevant to today's visit   The history is provided by the patient and a relative. No language interpreter was used.  Oral Pain  This is a new problem. The current episode started more than 1 week ago. The problem occurs constantly. The problem has been gradually worsening. Pertinent negatives include no chest pain, no abdominal pain, no headaches and no shortness of breath. Nothing aggravates the symptoms. Nothing relieves the symptoms. Treatments tried: mouth wash. The treatment provided no relief.    Past Medical History:  Diagnosis Date  . Asthma   . Bruises easily   . Complication of anesthesia   . COPD (chronic obstructive pulmonary disease) ([place])   . Emphysema lung ([place])   . GERD (gastroesophageal reflux disease)   . Glaucoma   . Hearing loss   . History of bladder problems   . Osteoarthritis   . Poor circulation   . PUD (peptic ulcer disease)   . Sinus disorder   . Venous stasis dermatitis     Patient Active Problem List   Diagnosis Date Noted  . Chronic cough 12/22/2015   . COPD type A ([place]) 05/06/2015    Past Surgical History:  Procedure Laterality Date  . ABDOMINAL HYSTERECTOMY    . ESOPHAGOGASTRODUODENOSCOPY (EGD) WITH PROPOFOL N/A 03/08/2016   Procedure: ESOPHAGOGASTRODUODENOSCOPY (EGD) WITH PROPOFOL;  Surgeon: Josefine Class, MD;  Location: [facility] ENDOSCOPY;  Service: Endoscopy;  Laterality: N/A;  . SURGERY OF LIP    . teeth pulled      OB History    No data available       Home Medications    Prior to Admission medications   Medication Sig Start Date End Date Taking? Authorizing Provider  acetic acid-hydrocortisone (VOSOL-HC) otic solution instill 4 drops into each ear four times a day if needed for ITCHY EARS 06/09/15  Yes [provider]  albuterol (VENTOLIN HFA) 108 (90 BASE) MCG/ACT inhaler Inhale 108 mcg into the lungs every 6 (six) hours as needed. 10/14/14 05/27/17 Yes [provider]  brimonidine (ALPHAGAN) 0.2 % ophthalmic solution Place 1 drop into both eyes daily. 09/09/14  Yes [provider]  Brinzolamide-Brimonidine (SIMBRINZA) 1-0.2 % SUSP instill 1 drop into right eye twice a day 05/21/15  Yes [provider]  carboxymethylcellulose (REFRESH PLUS) 0.5 % SOLN Apply 1-2 drops to eye daily as needed.   Yes [provider]  loratadine (CLARITIN) 10 MG tablet Take 10 mg by mouth daily.   Yes [provider]  triamcinolone cream (KENALOG) 0.1 %  Apply 0.1 g topically as needed. 11/04/14  Yes [provider]  clotrimazole (MYCELEX) 10 MG troche Take 1 tablet (10 mg total) by mouth 5 (five) times daily. 05/27/17   Frederich Cha, MD  omeprazole (PRILOSEC) 40 MG capsule Take 40 mg by mouth 2 (two) times daily. 12/15/15   [provider]  oxybutynin (DITROPAN) 5 MG tablet Take 5 mg by mouth 2 (two) times daily. 03/27/15   [provider]    Family History Family History  Problem Relation Age of Onset  . Coronary artery disease Mother   . Coronary artery disease  Father   . Stroke Father   . Stroke Sister   . Stroke Daughter   . Anxiety disorder Daughter   . Depression Daughter   . Skin cancer Daughter     Social History Social History  Substance Use Topics  . Smoking status: Former Research scientist (life sciences)  . Smokeless tobacco: Never Used  . Alcohol use No     Allergies   Beta adrenergic blockers; Sulfa antibiotics; and Xalatan [latanoprost]   Review of Systems Review of Systems  HENT: Positive for mouth sores and sore throat.   Respiratory: Negative for shortness of breath.   Cardiovascular: Negative for chest pain.  Gastrointestinal: Negative for abdominal pain.  Neurological: Negative for headaches.  All other systems reviewed and are negative.    Physical Exam Triage Vital Signs ED Triage Vitals  Enc Vitals Group     BP 05/27/17 1014 (!) 149/73     Pulse Rate 05/27/17 1014 75     Resp 05/27/17 1014 16     Temp 05/27/17 1014 98.3 F (36.8 C)     Temp Source 05/27/17 1014 Oral     SpO2 05/27/17 1014 99 %     Weight 05/27/17 1009 106 lb (48.1 kg)     Height 05/27/17 1009 5\' 1"  (1.549 m)     Head Circumference --      Peak Flow --      Pain Score 05/27/17 1009 7     Pain Loc --      Pain Edu? --      Excl. in [place]? --    No data found.   Updated Vital Signs BP (!) 149/73 (BP Location: Left Arm)   Pulse 75   Temp 98.3 F (36.8 C) (Oral)   Resp 16   Ht 5\' 1"  (1.549 m)   Wt 106 lb (48.1 kg)   SpO2 99%   BMI 20.03 kg/m   Visual Acuity Right Eye Distance:   Left Eye Distance:   Bilateral Distance:    Right Eye Near:   Left Eye Near:    Bilateral Near:     Physical Exam  Constitutional: She is oriented to person, place, and time. She appears well-developed and well-nourished. She is active.  Non-toxic appearance. She does not have a sickly appearance. She does not appear ill. No distress.  Patient is a 83 year old white female quite active and alert.  HENT:  Head: Normocephalic and atraumatic.  Right Ear: External ear  normal.  Left Ear: External ear normal.  Nose: Nose normal.  Mouth/Throat: She has dentures. Oral lesions present. Posterior oropharyngeal erythema present.  Hearing aids are present. Patient removes dentures that she was wearing she has multiple white spots in air. Areas of her mouth both under her gums in the back of her mouth and move her mouth as well.  Eyes: Pupils are equal, round, and reactive to light.  Neck: Normal range of motion.  Pulmonary/Chest: Effort normal.  Musculoskeletal: Normal range of motion.  Lymphadenopathy:    She has cervical adenopathy.  Neurological: She is alert and oriented to person, place, and time.  Skin: Skin is warm.  Psychiatric: She has a normal mood and affect.  Vitals reviewed.    UC Treatments / Results  Labs (all labs ordered are listed, but only abnormal results are displayed) Labs Reviewed  RAPID STREP SCREEN (NOT AT [facility]) - Abnormal; Notable for the following:       Result Value   Streptococcus, Group A Screen (Direct) POSITIVE (*)    All other components within normal limits    EKG  EKG Interpretation None       Radiology No results found.  Procedures Procedures (including critical care time)  Medications Ordered in UC Medications  penicillin g benzathine (BICILLIN LA) 1200000 UNIT/2ML injection 1.2 Million Units (not administered)   Results for orders placed or performed during the hospital encounter of 05/27/17  Rapid strep screen  Result Value Ref Range   Streptococcus, Group A Screen (Direct) POSITIVE (A) NEGATIVE    Initial Impression / Assessment and Plan / UC Course  I have reviewed the triage vital signs and the nursing notes.  Pertinent labs & imaging results that were available during my care of the patient were reviewed by me and considered in my medical decision making (see chart for details).    Strep test was obtained just to make sure there were not dealing with a strep infection as well talked to her  daughter actually feel that she has a yeast infection or thrush infection. Daughter wants to pick his judgment file it was a problem with but I'm going to prescribe Mycelex solution 5 times a day for the next 14 days if they want to include gentian violet drops that spine follow-up with ENT as needed or with PCP in about 3 weeks.  Will administer LA Bicillin 1.2 milliunits and strep test is positive we'll go ahead also still treat with Mycelex toes and they can use gentian violet if needed    Final Clinical Impressions(s) / UC Diagnoses   Final diagnoses:  Thrush, oral  Acute streptococcal pharyngitis    New Prescriptions New Prescriptions   CLOTRIMAZOLE (MYCELEX) 10 MG TROCHE    Take 1 tablet (10 mg total) by mouth 5 (five) times daily.    Note: This dictation was prepared with Dragon dictation along with smaller phrase technology. Any transcriptional errors that result from this process are unintentional.   Frederich Cha, MD 05/27/17 1057

## 2017-05-27 NOTE — ED Triage Notes (Signed)
Patient complains of oral pain, tongue pain and throat irritation. Patient states that she has had this in the past but recently worsened over the last week. Patient states that she has an appt to see [facility] next week. Patient reports that she has been using colgate peroxyl and that has seemed to help her tongue but not the back of her throat. Marland Kitchen

## 2017-05-27 NOTE — Discharge Instructions (Signed)
May also apply or use oral gentoviolet agent as well as directed

## 2017-08-03 ENCOUNTER — Ambulatory Visit: Payer: Medicare Other

## 2017-08-03 ENCOUNTER — Ambulatory Visit (INDEPENDENT_AMBULATORY_CARE_PROVIDER_SITE_OTHER): Payer: Medicare Other | Admitting: Podiatry

## 2017-08-03 ENCOUNTER — Encounter: Payer: Self-pay | Admitting: Podiatry

## 2017-08-03 DIAGNOSIS — M7751 Other enthesopathy of right foot: Secondary | ICD-10-CM | POA: Diagnosis not present

## 2017-08-03 DIAGNOSIS — M778 Other enthesopathies, not elsewhere classified: Secondary | ICD-10-CM

## 2017-08-03 DIAGNOSIS — Q828 Other specified congenital malformations of skin: Secondary | ICD-10-CM

## 2017-08-03 DIAGNOSIS — M2011 Hallux valgus (acquired), right foot: Secondary | ICD-10-CM | POA: Diagnosis not present

## 2017-08-03 DIAGNOSIS — M779 Enthesopathy, unspecified: Principal | ICD-10-CM

## 2017-08-03 NOTE — Progress Notes (Signed)
She presents today with chief complaint of painful callus to the plantar aspect and plantar medial aspect of the first metatarsophalangeal joint right foot. She states exquisitely painful with shoe gear. She states she would like to have it trimmed oh without pain.  Objective: Vital signs are stable she is alert and oriented 3 pulses are palpable. She has a poor keratotic lesion plantar medial aspect of the first metatarsophalangeal joint with bunion deformity present.  Assessment: Capsulitis neuritis painful or keratotic lesion.  Plan: I injected the area under the lesion today into the joint with 2 mg of dexamethasone and local anesthetic. This alleviated her symptoms and I was able to completely debride the lesion. I will follow-up with her on an as-needed basis.

## 2017-08-10 ENCOUNTER — Ambulatory Visit: Payer: Medicare Other

## 2017-08-10 ENCOUNTER — Encounter: Payer: Self-pay | Admitting: *Deleted

## 2017-08-10 ENCOUNTER — Ambulatory Visit
Admission: EM | Admit: 2017-08-10 | Discharge: 2017-08-10 | Disposition: A | Discharge status: Home / Self Care | Payer: Medicare Other | Attending: Family Medicine | Admitting: Family Medicine

## 2017-08-10 DIAGNOSIS — Z808 Family history of malignant neoplasm of other organs or systems: Secondary | ICD-10-CM | POA: Diagnosis not present

## 2017-08-10 DIAGNOSIS — R531 Weakness: Secondary | ICD-10-CM | POA: Diagnosis not present

## 2017-08-10 DIAGNOSIS — M1990 Unspecified osteoarthritis, unspecified site: Secondary | ICD-10-CM | POA: Diagnosis not present

## 2017-08-10 DIAGNOSIS — Z79899 Other long term (current) drug therapy: Secondary | ICD-10-CM | POA: Insufficient documentation

## 2017-08-10 DIAGNOSIS — Z818 Family history of other mental and behavioral disorders: Secondary | ICD-10-CM | POA: Insufficient documentation

## 2017-08-10 DIAGNOSIS — R0602 Shortness of breath: Secondary | ICD-10-CM

## 2017-08-10 DIAGNOSIS — Z8249 Family history of ischemic heart disease and other diseases of the circulatory system: Secondary | ICD-10-CM | POA: Diagnosis not present

## 2017-08-10 DIAGNOSIS — I872 Venous insufficiency (chronic) (peripheral): Secondary | ICD-10-CM | POA: Insufficient documentation

## 2017-08-10 DIAGNOSIS — J449 Chronic obstructive pulmonary disease, unspecified: Secondary | ICD-10-CM | POA: Diagnosis not present

## 2017-08-10 DIAGNOSIS — K219 Gastro-esophageal reflux disease without esophagitis: Secondary | ICD-10-CM | POA: Insufficient documentation

## 2017-08-10 DIAGNOSIS — Z882 Allergy status to sulfonamides status: Secondary | ICD-10-CM | POA: Diagnosis not present

## 2017-08-10 DIAGNOSIS — Z823 Family history of stroke: Secondary | ICD-10-CM | POA: Insufficient documentation

## 2017-08-10 DIAGNOSIS — Z87891 Personal history of nicotine dependence: Secondary | ICD-10-CM | POA: Insufficient documentation

## 2017-08-10 DIAGNOSIS — N390 Urinary tract infection, site not specified: Secondary | ICD-10-CM | POA: Insufficient documentation

## 2017-08-10 DIAGNOSIS — H409 Unspecified glaucoma: Secondary | ICD-10-CM | POA: Diagnosis not present

## 2017-08-10 DIAGNOSIS — Z8711 Personal history of peptic ulcer disease: Secondary | ICD-10-CM | POA: Diagnosis not present

## 2017-08-10 DIAGNOSIS — Z888 Allergy status to other drugs, medicaments and biological substances status: Secondary | ICD-10-CM | POA: Insufficient documentation

## 2017-08-10 DIAGNOSIS — E860 Dehydration: Secondary | ICD-10-CM

## 2017-08-10 LAB — URINALYSIS, COMPLETE (UACMP) WITH MICROSCOPIC
Bacteria, UA: NONE SEEN
Bilirubin Urine: NEGATIVE
GLUCOSE, UA: NEGATIVE mg/dL
KETONES UR: NEGATIVE mg/dL
LEUKOCYTES UA: NEGATIVE
Nitrite: NEGATIVE
PROTEIN: NEGATIVE mg/dL
SQUAMOUS EPITHELIAL / LPF: NONE SEEN
Specific Gravity, Urine: 1.01 (ref 1.005–1.030)
pH: 6 (ref 5.0–8.0)

## 2017-08-10 LAB — COMPREHENSIVE METABOLIC PANEL
ALBUMIN: 3.5 g/dL (ref 3.5–5.0)
ALT: 19 U/L (ref 14–54)
ANION GAP: 5 (ref 5–15)
AST: 31 U/L (ref 15–41)
Alkaline Phosphatase: 61 U/L (ref 38–126)
BUN: 21 mg/dL — AB (ref 6–20)
CALCIUM: 8.9 mg/dL (ref 8.9–10.3)
CHLORIDE: 107 mmol/L (ref 101–111)
CO2: 27 mmol/L (ref 22–32)
CREATININE: 1.09 mg/dL — AB (ref 0.44–1.00)
GFR calc Af Amer: 48 mL/min — ABNORMAL LOW (ref >60)
GFR, EST NON AFRICAN AMERICAN: 42 mL/min — AB (ref >60)
GLUCOSE: 95 mg/dL (ref 65–99)
Potassium: 4 mmol/L (ref 3.5–5.1)
Sodium: 139 mmol/L (ref 135–145)
TOTAL PROTEIN: 6.4 g/dL — AB (ref 6.5–8.1)
Total Bilirubin: 0.4 mg/dL (ref 0.3–1.2)

## 2017-08-10 LAB — CBC WITH DIFFERENTIAL/PLATELET
BASOS ABS: 0 10*3/uL (ref 0–0.1)
BASOS PCT: 1 %
EOS PCT: 2 %
Eosinophils Absolute: 0.1 10*3/uL (ref 0–0.7)
HCT: 39.2 % (ref 35.0–47.0)
Hemoglobin: 13.1 g/dL (ref 12.0–16.0)
Lymphocytes Relative: 28 %
Lymphs Abs: 1.5 10*3/uL (ref 1.0–3.6)
MCH: 31.2 pg (ref 26.0–34.0)
MCHC: 33.3 g/dL (ref 32.0–36.0)
MCV: 93.8 fL (ref 80.0–100.0)
MONO ABS: 0.7 10*3/uL (ref 0.2–0.9)
Monocytes Relative: 13 %
Neutro Abs: 3.1 10*3/uL (ref 1.4–6.5)
Neutrophils Relative %: 56 %
PLATELETS: 163 10*3/uL (ref 150–440)
RBC: 4.18 MIL/uL (ref 3.80–5.20)
RDW: 13.6 % (ref 11.5–14.5)
WBC: 5.4 10*3/uL (ref 3.6–11.0)

## 2017-08-10 MED ORDER — NITROFURANTOIN MONOHYD MACRO 100 MG PO CAPS: 100.0000 mg | ORAL_CAPSULE | Freq: Two times a day (BID) | ORAL | 0 refills | Status: DC

## 2017-08-10 NOTE — ED Triage Notes (Signed)
Patient started having weakness and SOB 4 days ago.

## 2017-08-10 NOTE — ED Provider Notes (Signed)
[facility]    CSN: [id number] Arrival date & time: 08/10/17  1649     History   Chief Complaint Chief Complaint  Patient presents with  . Shortness of Breath  . Weakness    HPI Kimberly Villarreal is a 83 y.o. female.   Patient is a 83 year old white female brought in by her daughter because of 5 days of general malaise. According to the daughter did talk to their home health nurse who was concerned because of patient's general malaise. She's had some shortness of breath burning with urination poor memory and general weakness. The home health nurse suggested she might have a UTI. The daughter states that she's will have the spells usually last for day 2 and that she's back to her normal baseline. This time as stated his been 5 days. She denies having a cough no chest pain. Some discomfort with urination she's also has some trouble with incontinence but at 89 we will not, be too upset with that. She also reports having some times trouble with her stream going the proper way and then she has to white the extra urine off to prevent irritation once again that something is going on for years.   The history is provided by the patient and a relative (THE DAUGHTER). No language interpreter was used.  Shortness of Breath  Severity:  Moderate Onset quality:  Sudden Timing:  Constant Progression:  Worsening Chronicity:  New Context: known allergens and strong odors   Worsened by:  Nothing Ineffective treatments:  None tried Associated symptoms: wheezing   Associated symptoms: no abdominal pain, no chest pain, no claudication, no fever, no rash, no sore throat and no sputum production   Associated symptoms comment:  She has used Ventolin some today Risk factors: no recent surgery   Weakness  Associated symptoms include shortness of breath. Pertinent negatives include no chest pain.    Past Medical History:  Diagnosis Date  . Asthma   . Bruises easily   . Complication of  anesthesia   . COPD (chronic obstructive pulmonary disease) ([place])   . Emphysema lung ([place])   . GERD (gastroesophageal reflux disease)   . Glaucoma   . Hearing loss   . History of bladder problems   . Osteoarthritis   . Poor circulation   . PUD (peptic ulcer disease)   . Sinus disorder   . Venous stasis dermatitis     Patient Active Problem List   Diagnosis Date Noted  . Chronic cough 12/22/2015  . COPD type A ([place]) 05/06/2015    Past Surgical History:  Procedure Laterality Date  . ABDOMINAL HYSTERECTOMY    . ESOPHAGOGASTRODUODENOSCOPY (EGD) WITH PROPOFOL N/A 03/08/2016   Procedure: ESOPHAGOGASTRODUODENOSCOPY (EGD) WITH PROPOFOL;  Surgeon: Josefine Class, MD;  Location: [facility] ENDOSCOPY;  Service: Endoscopy;  Laterality: N/A;  . SURGERY OF LIP    . teeth pulled      OB History    No data available       Home Medications    Prior to Admission medications   Medication Sig Start Date End Date Taking? Authorizing Provider  acetic acid-hydrocortisone (VOSOL-HC) otic solution instill 4 drops into each ear four times a day if needed for ITCHY EARS 06/09/15   [provider]  albuterol (VENTOLIN HFA) 108 (90 BASE) MCG/ACT inhaler Inhale 108 mcg into the lungs every 6 (six) hours as needed. 10/14/14 05/27/17  [provider]  brimonidine (ALPHAGAN) 0.2 % ophthalmic solution Place 1 drop  into both eyes daily. 09/09/14   [provider]  Brinzolamide-Brimonidine (SIMBRINZA) 1-0.2 % SUSP instill 1 drop into right eye twice a day 05/21/15   [provider]  carboxymethylcellulose (REFRESH PLUS) 0.5 % SOLN Apply 1-2 drops to eye daily as needed.    [provider]  clotrimazole (MYCELEX) 10 MG troche Take 1 tablet (10 mg total) by mouth 5 (five) times daily. 05/27/17   Frederich Cha, MD  loratadine (CLARITIN) 10 MG tablet Take 10 mg by mouth daily.    [provider]  nitrofurantoin, macrocrystal-monohydrate, (MACROBID) 100 MG capsule  Take 1 capsule (100 mg total) by mouth 2 (two) times daily. 08/10/17   Frederich Cha, MD  omeprazole (PRILOSEC) 40 MG capsule Take 40 mg by mouth 2 (two) times daily. 12/15/15   [provider]  oxybutynin (DITROPAN) 5 MG tablet Take 5 mg by mouth 2 (two) times daily. 03/27/15   [provider]  triamcinolone cream (KENALOG) 0.1 % Apply 0.1 g topically as needed. 11/04/14   [provider]    Family History Family History  Problem Relation Age of Onset  . Coronary artery disease Mother   . Coronary artery disease Father   . Stroke Father   . Stroke Sister   . Stroke Daughter   . Anxiety disorder Daughter   . Depression Daughter   . Skin cancer Daughter     Social History Social History  Substance Use Topics  . Smoking status: Former Research scientist (life sciences)  . Smokeless tobacco: Never Used  . Alcohol use No     Allergies   Beta adrenergic blockers; Sulfa antibiotics; and Xalatan [latanoprost]   Review of Systems Review of Systems  Constitutional: Negative for fever.  HENT: Negative for sore throat.   Respiratory: Positive for shortness of breath and wheezing. Negative for sputum production.   Cardiovascular: Negative for chest pain and claudication.  Gastrointestinal: Negative for abdominal pain.  Skin: Negative for rash.  Neurological: Positive for weakness.  All other systems reviewed and are negative.    Physical Exam Triage Vital Signs ED Triage Vitals [08/10/17 1700]  Enc Vitals Group     BP (!) 164/70     Pulse Rate 72     Resp 16     Temp 97.7 F (36.5 C)     Temp Source Oral     SpO2 99 %     Weight 105 lb (47.6 kg)     Height 5\' 1"  (1.549 m)     Head Circumference      Peak Flow      Pain Score 0     Pain Loc      Pain Edu?      Excl. in [place]?    No data found.   Updated Vital Signs BP (!) 164/70 (BP Location: Right Arm)   Pulse 72   Temp 97.7 F (36.5 C) (Oral)   Resp 16   Ht 5\' 1"  (1.549 m)   Wt 105 lb (47.6 kg)   SpO2 99%    BMI 19.84 kg/m   Visual Acuity Right Eye Distance:   Left Eye Distance:   Bilateral Distance:    Right Eye Near:   Left Eye Near:    Bilateral Near:     Physical Exam  Constitutional: She appears cachectic. She is active.  Non-toxic appearance. She does not have a sickly appearance. She does not appear ill. No distress.  HENT:  Head: Normocephalic and atraumatic.  Right Ear: Tympanic  membrane normal. Decreased hearing is noted.  Left Ear: Tympanic membrane normal. Decreased hearing is noted.  Mouth/Throat: Uvula is midline, oropharynx is clear and moist and mucous membranes are normal.  Eyes: Pupils are equal, round, and reactive to light.  Neck: Neck supple.  Cardiovascular: Normal rate, regular rhythm and normal heart sounds.   Pulmonary/Chest: Effort normal and breath sounds normal.  Pulse ox is 99  Abdominal: Soft.  Musculoskeletal: Normal range of motion.  Neurological: She is alert.  Skin: Skin is warm. No erythema.  Psychiatric: She has a normal mood and affect.  Vitals reviewed.    UC Treatments / Results  Labs (all labs ordered are listed, but only abnormal results are displayed) Labs Reviewed  URINALYSIS, COMPLETE (UACMP) WITH MICROSCOPIC - Abnormal; Notable for the following:       Result Value   Hgb urine dipstick TRACE (*)    All other components within normal limits  COMPREHENSIVE METABOLIC PANEL - Abnormal; Notable for the following:    BUN 21 (*)    Creatinine, Ser 1.09 (*)    Total Protein 6.4 (*)    GFR calc non Af Amer 42 (*)    GFR calc Af Amer 48 (*)    All other components within normal limits  URINE CULTURE  CBC WITH DIFFERENTIAL/PLATELET    EKG  EKG Interpretation None       Radiology Dg Chest 2 View  Result Date: 08/10/2017 CLINICAL DATA:  Shortness of breath and weakness for 4 days. EXAM: CHEST  2 VIEW COMPARISON:  12/22/2015 FINDINGS: Stable hyperinflation and emphysematous changes. No focal airspace disease. No evidence for  pulmonary edema. Heart and mediastinum are within normal limits. Levoscoliosis of the thoracolumbar spine. Atherosclerotic calcifications at the aortic arch. Scarring at the right lung apex. No large pleural effusions. IMPRESSION: Chronic lung changes without acute findings. Electronically Signed   By: Markus Daft M.D.   On: 08/10/2017 18:17    Procedures Procedures (including critical care time)  Medications Ordered in UC Medications - No data to display   Initial Impression / Assessment and Plan / UC Course  I have reviewed the triage vital signs and the nursing notes.  Pertinent labs & imaging results that were available during my care of the patient were reviewed by me and considered in my medical decision making (see chart for details).   chest x-ray showed chronic changes urine was unremarkable cultures pending being creatinine is elevated showing some early mild dehydration. Recommend increase fluid intake. Follow-up PCP early next week if not better we'll place on Macrobid 100 mg twice a day for 7 days and awaiting urine culture. If symptoms get worse talk to her daughter about taken to the ED ASAP right now she does look very stable    Final Clinical Impressions(s) / UC Diagnoses   Final diagnoses:  Generalized weakness  Shortness of breath  Lower urinary tract infectious disease  Dehydration    New Prescriptions New Prescriptions   NITROFURANTOIN, MACROCRYSTAL-MONOHYDRATE, (MACROBID) 100 MG CAPSULE    Take 1 capsule (100 mg total) by mouth 2 (two) times daily.   Note: This dictation was prepared with Dragon dictation along with smaller phrase technology. Any transcriptional errors that result from this process are unintentional.  Controlled Substance Prescriptions [place] Controlled Substance Registry consulted? Not Applicable   Frederich Cha, MD 08/10/17 1843

## 2017-08-12 LAB — URINE CULTURE
Culture: NO GROWTH
Special Requests: NORMAL

## 2017-08-29 DIAGNOSIS — R1310 Dysphagia, unspecified: Secondary | ICD-10-CM | POA: Insufficient documentation

## 2017-10-04 DIAGNOSIS — G629 Polyneuropathy, unspecified: Secondary | ICD-10-CM | POA: Insufficient documentation

## 2017-10-04 DIAGNOSIS — G3184 Mild cognitive impairment, so stated: Secondary | ICD-10-CM | POA: Insufficient documentation

## 2017-12-14 ENCOUNTER — Other Ambulatory Visit: Payer: Self-pay | Admitting: Family Medicine

## 2017-12-14 DIAGNOSIS — M79661 Pain in right lower leg: Secondary | ICD-10-CM

## 2017-12-14 DIAGNOSIS — I878 Other specified disorders of veins: Secondary | ICD-10-CM

## 2017-12-19 ENCOUNTER — Ambulatory Visit: Payer: Medicare Other

## 2018-03-08 ENCOUNTER — Emergency Department
Admission: EM | Admit: 2018-03-08 | Discharge: 2018-03-08 | Disposition: A | Discharge status: Home / Self Care | Payer: Medicare Other | Attending: Student in an Organized Health Care Education/Training Program | Admitting: Student in an Organized Health Care Education/Training Program

## 2018-03-08 ENCOUNTER — Emergency Department: Payer: Medicare Other

## 2018-03-08 ENCOUNTER — Other Ambulatory Visit: Payer: Self-pay

## 2018-03-08 DIAGNOSIS — I2510 Atherosclerotic heart disease of native coronary artery without angina pectoris: Secondary | ICD-10-CM | POA: Insufficient documentation

## 2018-03-08 DIAGNOSIS — Z87891 Personal history of nicotine dependence: Secondary | ICD-10-CM | POA: Diagnosis not present

## 2018-03-08 DIAGNOSIS — J449 Chronic obstructive pulmonary disease, unspecified: Secondary | ICD-10-CM | POA: Insufficient documentation

## 2018-03-08 DIAGNOSIS — R079 Chest pain, unspecified: Secondary | ICD-10-CM | POA: Diagnosis present

## 2018-03-08 DIAGNOSIS — Z79899 Other long term (current) drug therapy: Secondary | ICD-10-CM | POA: Diagnosis not present

## 2018-03-08 LAB — CBC
HCT: 37.2 % (ref 35.0–47.0)
Hemoglobin: 12.4 g/dL (ref 12.0–16.0)
MCH: 31.6 pg (ref 26.0–34.0)
MCHC: 33.3 g/dL (ref 32.0–36.0)
MCV: 94.8 fL (ref 80.0–100.0)
PLATELETS: 161 10*3/uL (ref 150–440)
RBC: 3.92 MIL/uL (ref 3.80–5.20)
RDW: 13.4 % (ref 11.5–14.5)
WBC: 4.6 10*3/uL (ref 3.6–11.0)

## 2018-03-08 LAB — BASIC METABOLIC PANEL
Anion gap: 3 — ABNORMAL LOW (ref 5–15)
BUN: 18 mg/dL (ref 6–20)
CO2: 29 mmol/L (ref 22–32)
CREATININE: 1.08 mg/dL — AB (ref 0.44–1.00)
Calcium: 8.5 mg/dL — ABNORMAL LOW (ref 8.9–10.3)
Chloride: 108 mmol/L (ref 101–111)
GFR calc Af Amer: 49 mL/min — ABNORMAL LOW (ref >60)
GFR, EST NON AFRICAN AMERICAN: 42 mL/min — AB (ref >60)
Glucose, Bld: 91 mg/dL (ref 65–99)
Potassium: 3.8 mmol/L (ref 3.5–5.1)
SODIUM: 140 mmol/L (ref 135–145)

## 2018-03-08 LAB — TROPONIN I: Troponin I: <0.03 ng/mL (ref <0.03)

## 2018-03-08 MED ORDER — IPRATROPIUM-ALBUTEROL 0.5-2.5 (3) MG/3ML IN SOLN: 3.0000 mL | Freq: Once | RESPIRATORY_TRACT | Status: DC

## 2018-03-08 NOTE — ED Triage Notes (Signed)
To ER via [facility] from home c/o chest pain that began while at home sitting in recliner. Pt received 324 ASA prior to ER arrival and 1 nitro spray with EMS. currently pain free.

## 2018-03-08 NOTE — ED Provider Notes (Signed)
[facility] Emergency Department Provider Note    First MD Initiated Contact with Patient 03/08/18 1624     (approximate)  I have reviewed the triage vital signs and the nursing notes.   HISTORY  Chief Complaint Chest Pain    HPI Kimberly Villarreal is a 84 y.o. female with a history of COPD presents with midsternal chest pain nonradiating that lasted roughly 30 minutes while the patient was watching TV.  No exertion.  No diaphoresis.  Felt that her face was flushed.  Never had pain like this before.  Denies any pain right now.  No pain or discomfort shooting or tearing through to her back.  No lower extremity swelling.  No fevers.  Has had some nausea but none right now and not associated with her chest pain.  No vomiting.   Past Medical History:  Diagnosis Date  . Asthma   . Bruises easily   . Complication of anesthesia   . COPD (chronic obstructive pulmonary disease) ([place])   . Emphysema lung ([place])   . GERD (gastroesophageal reflux disease)   . Glaucoma   . Hearing loss   . History of bladder problems   . Osteoarthritis   . Poor circulation   . PUD (peptic ulcer disease)   . Sinus disorder   . Venous stasis dermatitis    Family History  Problem Relation Age of Onset  . Coronary artery disease Mother   . Coronary artery disease Father   . Stroke Father   . Stroke Sister   . Stroke Daughter   . Anxiety disorder Daughter   . Depression Daughter   . Skin cancer Daughter    Past Surgical History:  Procedure Laterality Date  . ABDOMINAL HYSTERECTOMY    . ESOPHAGOGASTRODUODENOSCOPY (EGD) WITH PROPOFOL N/A 03/08/2016   Procedure: ESOPHAGOGASTRODUODENOSCOPY (EGD) WITH PROPOFOL;  Surgeon: Josefine Class, MD;  Location: [facility] ENDOSCOPY;  Service: Endoscopy;  Laterality: N/A;  . SURGERY OF LIP    . teeth pulled     Patient Active Problem List   Diagnosis Date Noted  . Chronic cough 12/22/2015  . COPD type A ([place]) 05/06/2015      Prior to  Admission medications   Medication Sig Start Date End Date Taking? Authorizing Provider  acetic acid-hydrocortisone (VOSOL-HC) otic solution instill 4 drops into each ear four times a day if needed for ITCHY EARS 06/09/15   [provider]  albuterol (VENTOLIN HFA) 108 (90 BASE) MCG/ACT inhaler Inhale 108 mcg into the lungs every 6 (six) hours as needed. 10/14/14 05/27/17  [provider]  brimonidine (ALPHAGAN) 0.2 % ophthalmic solution Place 1 drop into both eyes daily. 09/09/14   [provider]  Brinzolamide-Brimonidine (SIMBRINZA) 1-0.2 % SUSP instill 1 drop into right eye twice a day 05/21/15   [provider]  carboxymethylcellulose (REFRESH PLUS) 0.5 % SOLN Apply 1-2 drops to eye daily as needed.    [provider]  clotrimazole (MYCELEX) 10 MG troche Take 1 tablet (10 mg total) by mouth 5 (five) times daily. 05/27/17   Frederich Cha, MD  loratadine (CLARITIN) 10 MG tablet Take 10 mg by mouth daily.    [provider]  nitrofurantoin, macrocrystal-monohydrate, (MACROBID) 100 MG capsule Take 1 capsule (100 mg total) by mouth 2 (two) times daily. 08/10/17   Frederich Cha, MD  omeprazole (PRILOSEC) 40 MG capsule Take 40 mg by mouth 2 (two) times daily. 12/15/15   [provider]  oxybutynin (DITROPAN) 5 MG  tablet Take 5 mg by mouth 2 (two) times daily. 03/27/15   [provider]  triamcinolone cream (KENALOG) 0.1 % Apply 0.1 g topically as needed. 11/04/14   [provider]    Allergies Beta adrenergic blockers; Sulfa antibiotics; and Xalatan [latanoprost]    Social History Social History   Tobacco Use  . Smoking status: Former Research scientist (life sciences)  . Smokeless tobacco: Never Used  Substance Use Topics  . Alcohol use: No    Alcohol/week: 0.0 oz  . Drug use: No    Review of Systems Patient denies headaches, rhinorrhea, blurry vision, numbness, shortness of breath, chest pain, edema, cough, abdominal pain, nausea, vomiting,  diarrhea, dysuria, fevers, rashes or hallucinations unless otherwise stated above in HPI. ____________________________________________   PHYSICAL EXAM:  VITAL SIGNS: Vitals:   03/08/18 1628  Pulse: 74  Resp: 18  Temp: 98.4 F (36.9 C)  SpO2: 98%    Constitutional: Alert and oriented. Well appearing and in no acute distress. Eyes: Conjunctivae are normal.  Head: Atraumatic. Nose: No congestion/rhinnorhea. Mouth/Throat: Mucous membranes are moist.   Neck: No stridor. Painless ROM.  Cardiovascular: Normal rate, regular rhythm. Grossly normal heart sounds.  Good peripheral circulation. Respiratory: Normal respiratory effort.  No retractions. Lungs CTAB. Gastrointestinal: Soft and nontender. No distention. No abdominal bruits. No CVA tenderness. Genitourinary:  Musculoskeletal: No lower extremity tenderness nor edema.  No joint effusions. Neurologic:  Normal speech and language. No gross focal neurologic deficits are appreciated. No facial droop Skin:  Skin is warm, dry and intact. No rash noted. Psychiatric: Mood and affect are normal. Speech and behavior are normal.  ____________________________________________   LABS (all labs ordered are listed, but only abnormal results are displayed)  Results for orders placed or performed during the hospital encounter of 03/08/18 (from the past 24 hour(s))  Basic metabolic panel     Status: Abnormal   Collection Time: 03/08/18  4:32 PM  Result Value Ref Range   Sodium 140 135 - 145 mmol/L   Potassium 3.8 3.5 - 5.1 mmol/L   Chloride 108 101 - 111 mmol/L   CO2 29 22 - 32 mmol/L   Glucose, Bld 91 65 - 99 mg/dL   BUN 18 6 - 20 mg/dL   Creatinine, Ser 1.08 (H) 0.44 - 1.00 mg/dL   Calcium 8.5 (L) 8.9 - 10.3 mg/dL   GFR calc non Af Amer 42 (L) >60 mL/min   GFR calc Af Amer 49 (L) >60 mL/min   Anion gap 3 (L) 5 - 15  CBC     Status: None   Collection Time: 03/08/18  4:32 PM  Result Value Ref Range   WBC 4.6 3.6 - 11.0 K/uL   RBC 3.92  3.80 - 5.20 MIL/uL   Hemoglobin 12.4 12.0 - 16.0 g/dL   HCT 37.2 35.0 - 47.0 %   MCV 94.8 80.0 - 100.0 fL   MCH 31.6 26.0 - 34.0 pg   MCHC 33.3 32.0 - 36.0 g/dL   RDW 13.4 11.5 - 14.5 %   Platelets 161 150 - 440 K/uL  Troponin I     Status: None   Collection Time: 03/08/18  4:32 PM  Result Value Ref Range   Troponin I <0.03 <0.03 ng/mL  Troponin I     Status: None   Collection Time: 03/08/18  7:26 PM  Result Value Ref Range   Troponin I <0.03 <0.03 ng/mL   ____________________________________________  EKG My review and personal interpretation at Time: 16:29   Indication: chest  pain  Rate: 70  Rhythm: sinus Axis: normal  Other: normal intervals, no stemi ____________________________________________  RADIOLOGY  I personally reviewed all radiographic images ordered to evaluate for the above acute complaints and reviewed radiology reports and findings.  These findings were personally discussed with the patient.  Please see medical record for radiology report.  ____________________________________________   PROCEDURES  Procedure(s) performed:  Procedures    Critical Care performed: no ____________________________________________   INITIAL IMPRESSION / ASSESSMENT AND PLAN / ED COURSE  Pertinent labs & imaging results that were available during my care of the patient were reviewed by me and considered in my medical decision making (see chart for details).  DDX: ACS, pericarditis, esophagitis, boerhaaves, pe, dissection, pna, bronchitis, costochondritis   Rayana Geurin is a 84 y.o. who presents to the ED with chest discomfort as described above.  Currently she is chest pain-free.  Based on her age and risk factors will order serial enzymes.  Initial EKG shows no evidence of acute ischemia.  No evidence of heart failure.  Not clinically consistent with dissection or PE.  Will send blood work for the above differential and continue to observe.  Clinical Course as of Mar 09 2047  Wed Mar 08, 2018  2029 Repeat troponin negative.  Patient remains chest pain-free.  Does have history of reflux and says it felt somewhat similar to that.  At this point I do believe patient stable and appropriate for discharge home she is tolerating oral hydration with no pain or complaints at this time and has adequate outpatient follow-up.  Have discussed with the patient and available family all diagnostics and treatments performed thus far and all questions were answered to the best of my ability. The patient demonstrates understanding and agreement with plan.    [PR]    Clinical Course User Index [PR] Merlyn Lot, MD     As part of my medical decision making, I reviewed the following data within the [facility] notes reviewed and incorporated, Labs reviewed, notes from prior ED visits and [place] Controlled Substance Database   ____________________________________________   FINAL CLINICAL IMPRESSION(S) / ED DIAGNOSES  Final diagnoses:  Chest pain, unspecified type      NEW MEDICATIONS STARTED DURING THIS VISIT:  New Prescriptions   No medications on file     Note:  This document was prepared using Dragon voice recognition software and may include unintentional dictation errors.    Merlyn Lot, MD 03/08/18 2049

## 2018-03-08 NOTE — ED Notes (Signed)
Family at bedside. 

## 2018-03-08 NOTE — ED Notes (Signed)
Patient transported to X-ray 

## 2018-03-08 NOTE — ED Notes (Signed)
Assisted to and from toilet. Given meal tray and drink. Updated on plan of care.

## 2018-05-26 ENCOUNTER — Encounter (INDEPENDENT_AMBULATORY_CARE_PROVIDER_SITE_OTHER): Payer: Self-pay | Admitting: Vascular Surgery

## 2018-05-26 ENCOUNTER — Ambulatory Visit (INDEPENDENT_AMBULATORY_CARE_PROVIDER_SITE_OTHER): Payer: Medicare Other | Admitting: Vascular Surgery

## 2018-05-26 VITALS — BP 135/79 | HR 74 | Resp 13 | Ht 61.0 in | Wt 107.0 lb

## 2018-05-26 DIAGNOSIS — R609 Edema, unspecified: Secondary | ICD-10-CM | POA: Insufficient documentation

## 2018-05-26 DIAGNOSIS — K219 Gastro-esophageal reflux disease without esophagitis: Secondary | ICD-10-CM | POA: Diagnosis not present

## 2018-05-26 NOTE — Progress Notes (Signed)
MRN : [id number]  Kimberly Villarreal is a 84 y.o. (May 06, 1920) female who presents with chief complaint of  Chief Complaint  Patient presents with  . Follow-up    Pain and swelling in both legs, more in the Right leg  .  History of Present Illness: Patient returns today in follow up. She has not been seen for a couple of years.  She is having more pain and swelling in her legs particularly on the right.  She has a friend who has a lymphedema pump and wanted to ask about that.  She is having other issues including arthritis but continues to be very active and vigorous despite her advancing age.  No ulceration or infection.  Current Outpatient Medications  Medication Sig Dispense Refill  . albuterol (VENTOLIN HFA) 108 (90 BASE) MCG/ACT inhaler Inhale 108 mcg into the lungs every 6 (six) hours as needed for wheezing or shortness of breath.     . brimonidine (ALPHAGAN) 0.2 % ophthalmic solution Place 1 drop into both eyes daily.    . Brinzolamide-Brimonidine (SIMBRINZA) 1-0.2 % SUSP instill 1 drop into right eye twice a day    . carboxymethylcellulose (REFRESH PLUS) 0.5 % SOLN Apply 1-2 drops to eye daily as needed (dry eyes).     . cetirizine (ZYRTEC) 10 MG tablet Take 10 mg by mouth daily.    . chlorthalidone (HYGROTON) 25 MG tablet Take by mouth.    . clotrimazole (MYCELEX) 10 MG troche Take 1 tablet (10 mg total) by mouth 5 (five) times daily. 70 tablet 0  . DHA-EPA-Flaxseed Oil-Vitamin E (THERATEARS NUTRITION) CAPS Take by mouth.    . gabapentin (NEURONTIN) 100 MG capsule Take 100-200 mg by mouth at bedtime.    . mometasone (ELOCON) 0.1 % cream Apply topically.    . montelukast (SINGULAIR) 10 MG tablet Take 10 mg by mouth at bedtime.    . naproxen sodium (ALEVE) 220 MG tablet Take by mouth.    . nitrofurantoin, macrocrystal-monohydrate, (MACROBID) 100 MG capsule Take 1 capsule (100 mg total) by mouth 2 (two) times daily. 14 capsule 0  . oxybutynin (DITROPAN) 5 MG tablet Take 5 mg by mouth  3 (three) times daily between meals as needed (overactive bladder).     . pantoprazole (PROTONIX) 40 MG tablet Take 40 mg by mouth daily.     No current facility-administered medications for this visit.     Past Medical History:  Diagnosis Date  . Asthma   . Bruises easily   . Complication of anesthesia   . COPD (chronic obstructive pulmonary disease) ([place])   . Emphysema lung ([place])   . GERD (gastroesophageal reflux disease)   . Glaucoma   . Hearing loss   . History of bladder problems   . Osteoarthritis   . Poor circulation   . PUD (peptic ulcer disease)   . Sinus disorder   . Venous stasis dermatitis     Past Surgical History:  Procedure Laterality Date  . ABDOMINAL HYSTERECTOMY    . ESOPHAGOGASTRODUODENOSCOPY (EGD) WITH PROPOFOL N/A 03/08/2016   Procedure: ESOPHAGOGASTRODUODENOSCOPY (EGD) WITH PROPOFOL;  Surgeon: Josefine Class, MD;  Location: [facility] ENDOSCOPY;  Service: Endoscopy;  Laterality: N/A;  . SURGERY OF LIP    . teeth pulled      Social History Social History   Tobacco Use  . Smoking status: Former Research scientist (life sciences)  . Smokeless tobacco: Never Used  Substance Use Topics  . Alcohol use: No    Alcohol/week: 0.0 oz  .  Drug use: No    Family History Family History  Problem Relation Age of Onset  . Coronary artery disease Mother   . Coronary artery disease Father   . Stroke Father   . Stroke Sister   . Stroke Daughter   . Anxiety disorder Daughter   . Depression Daughter   . Skin cancer Daughter     Allergies  Allergen Reactions  . Beta Adrenergic Blockers Other (See Comments)    unknown  . Sulfa Antibiotics Other (See Comments)    Mouth sores  . Xalatan [Latanoprost] Other (See Comments)    unknown     REVIEW OF SYSTEMS (Negative unless checked)  Constitutional: []Weight loss  []Fever  []Chills Cardiac: []Chest pain   []Chest pressure   []Palpitations   []Shortness of breath when laying flat   []Shortness of breath at rest   []Shortness of breath  with exertion. Vascular:  [x]Pain in legs with walking   []Pain in legs at rest   []Pain in legs when laying flat   []Claudication   []Pain in feet when walking  []Pain in feet at rest  []Pain in feet when laying flat   []History of DVT   []Phlebitis   [x]Swelling in legs   [x]Varicose veins   []Non-healing ulcers Pulmonary:   []Uses home oxygen   []Productive cough   []Hemoptysis   []Wheeze  [x]COPD   []Asthma Neurologic:  []Dizziness  []Blackouts   []Seizures   []History of stroke   []History of TIA  []Aphasia   []Temporary blindness   []Dysphagia   []Weakness or numbness in arms   []Weakness or numbness in legs Musculoskeletal:  []Arthritis   []Joint swelling   []Joint pain   []Low back pain Hematologic:  []Easy bruising  []Easy bleeding   []Hypercoagulable state   []Anemic   Gastrointestinal:  []Blood in stool   []Vomiting blood  []Gastroesophageal reflux/heartburn   []Abdominal pain Genitourinary:  []Chronic kidney disease   []Difficult urination  []Frequent urination  []Burning with urination   []Hematuria Skin:  []Rashes   []Ulcers   []Wounds Psychological:  []History of anxiety   [] History of major depression.  Physical Examination  BP 135/79 (BP Location: Right Arm, Patient Position: Sitting)   Pulse 74   Resp 13   Ht 5' 1" (1.549 m)   Wt 107 lb (48.5 kg)   BMI 20.22 kg/m  Gen:  WD/WN, NAD.  Appears far younger than her stated age Head: [place]/AT, No temporalis wasting. Ear/Nose/Throat: Hearing diminished, nares w/o erythema or drainage Eyes: Conjunctiva clear. Sclera non-icteric Neck: Supple.  Trachea midline Pulmonary:  Good air movement, no use of accessory muscles.  Cardiac: RRR, no JVD Vascular:  Vessel Right Left  Radial Palpable Palpable                          PT Trace Palpable 1+ Palpable  DP Palpable Palpable    Musculoskeletal: M/S 5/5 throughout.  No deformity or atrophy.  2-3+ right lower extremity edema, 2+ left lower extremity edema. Neurologic:  Sensation grossly intact in extremities.  Symmetrical.  Speech is fluent.  Psychiatric: Judgment intact, Mood & affect appropriate for pt's clinical situation. Dermatologic: No rashes or ulcers noted.  No cellulitis or open wounds.       Labs Recent Results (from the past 2160 hour(s))  Basic metabolic panel     Status: Abnormal   Collection Time: 03/08/18  4:32 PM  Result Value   Ref Range   Sodium 140 135 - 145 mmol/L   Potassium 3.8 3.5 - 5.1 mmol/L   Chloride 108 101 - 111 mmol/L   CO2 29 22 - 32 mmol/L   Glucose, Bld 91 65 - 99 mg/dL   BUN 18 6 - 20 mg/dL   Creatinine, Ser 1.08 (H) 0.44 - 1.00 mg/dL   Calcium 8.5 (L) 8.9 - 10.3 mg/dL   GFR calc non Af Amer 42 (L) >60 mL/min   GFR calc Af Amer 49 (L) >60 mL/min    Comment: (NOTE) The eGFR has been calculated using the CKD EPI equation. This calculation has not been validated in all clinical situations. eGFR's persistently <60 mL/min signify possible Chronic Kidney Disease.    Anion gap 3 (L) 5 - 15    Comment: Performed at [facility], [address] [place], [place] [address]  CBC     Status: None   Collection Time: 03/08/18  4:32 PM  Result Value Ref Range   WBC 4.6 3.6 - 11.0 K/uL   RBC 3.92 3.80 - 5.20 MIL/uL   Hemoglobin 12.4 12.0 - 16.0 g/dL   HCT 37.2 35.0 - 47.0 %   MCV 94.8 80.0 - 100.0 fL   MCH 31.6 26.0 - 34.0 pg   MCHC 33.3 32.0 - 36.0 g/dL   RDW 13.4 11.5 - 14.5 %   Platelets 161 150 - 440 K/uL    Comment: Performed at [facility], [address] [place], [place] [address]  Troponin I     Status: None   Collection Time: 03/08/18  4:32 PM  Result Value Ref Range   Troponin I <0.03 <0.03 ng/mL    Comment: Performed at [facility], [address] [place], [place] [address]  Troponin I     Status: None   Collection Time: 03/08/18  7:26 PM  Result Value Ref Range   Troponin I <0.03 <0.03 ng/mL    Comment: Performed at  Hospital [facility], [address]  [place], [place] [address]    Radiology No results found.  Assessment/Plan  GERD (gastroesophageal reflux disease) Continue antihypertensive medications as already ordered, these medications have been reviewed and there are no changes at this time.  Avoidence of caffeine and alcohol  Moderate elevation of the head of the bed   Edema The patient has prominent lower extremity swelling.  This is likely lymphedema from scarring and lymph channels chronically.  I have discussed elevation and compression.  The patient wants to use Ace wraps which is reasonable if they are done daily.  I have discussed and recommended the use of the lymphedema pump but the patient does not appear to be interested in this device at this time.  She will contact our office if she decides to pursue things further.     , MD  05/26/2018 3:16 PM    This note was created with Dragon medical transcription system.  Any errors from dictation are purely unintentional 

## 2018-05-26 NOTE — Patient Instructions (Signed)

## 2018-05-26 NOTE — Assessment & Plan Note (Signed)
Continue antihypertensive medications as already ordered, these medications have been reviewed and there are no changes at this time.  Avoidence of caffeine and alcohol  Moderate elevation of the head of the bed  

## 2018-05-26 NOTE — Assessment & Plan Note (Signed)
The patient has prominent lower extremity swelling.  This is likely lymphedema from scarring and lymph channels chronically.  I have discussed elevation and compression.  The patient wants to use Ace wraps which is reasonable if they are done daily.  I have discussed and recommended the use of the lymphedema pump but the patient does not appear to be interested in this device at this time.  She will contact our office if she decides to pursue things further.

## 2018-07-10 DIAGNOSIS — L299 Pruritus, unspecified: Secondary | ICD-10-CM | POA: Insufficient documentation

## 2018-08-02 ENCOUNTER — Emergency Department
Admission: EM | Admit: 2018-08-02 | Discharge: 2018-08-02 | Disposition: A | Discharge status: Home / Self Care | Payer: Medicare Other | Attending: Emergency Medicine | Admitting: Emergency Medicine

## 2018-08-02 ENCOUNTER — Emergency Department: Payer: Medicare Other

## 2018-08-02 DIAGNOSIS — R002 Palpitations: Secondary | ICD-10-CM | POA: Diagnosis not present

## 2018-08-02 DIAGNOSIS — R600 Localized edema: Secondary | ICD-10-CM | POA: Insufficient documentation

## 2018-08-02 DIAGNOSIS — J449 Chronic obstructive pulmonary disease, unspecified: Secondary | ICD-10-CM | POA: Diagnosis not present

## 2018-08-02 DIAGNOSIS — Z87891 Personal history of nicotine dependence: Secondary | ICD-10-CM | POA: Diagnosis not present

## 2018-08-02 DIAGNOSIS — R05 Cough: Secondary | ICD-10-CM | POA: Diagnosis present

## 2018-08-02 DIAGNOSIS — R059 Cough, unspecified: Secondary | ICD-10-CM

## 2018-08-02 LAB — CBC WITH DIFFERENTIAL/PLATELET
BASOS ABS: 0 10*3/uL (ref 0–0.1)
BASOS PCT: 1 %
EOS ABS: 0.1 10*3/uL (ref 0–0.7)
Eosinophils Relative: 1 %
HCT: 35.8 % (ref 35.0–47.0)
HEMOGLOBIN: 12.1 g/dL (ref 12.0–16.0)
Lymphocytes Relative: 21 %
Lymphs Abs: 1.1 10*3/uL (ref 1.0–3.6)
MCH: 32 pg (ref 26.0–34.0)
MCHC: 33.8 g/dL (ref 32.0–36.0)
MCV: 94.6 fL (ref 80.0–100.0)
Monocytes Absolute: 0.6 10*3/uL (ref 0.2–0.9)
Monocytes Relative: 11 %
NEUTROS ABS: 3.6 10*3/uL (ref 1.4–6.5)
NEUTROS PCT: 66 %
Platelets: 156 10*3/uL (ref 150–440)
RBC: 3.79 MIL/uL — AB (ref 3.80–5.20)
RDW: 14.4 % (ref 11.5–14.5)
WBC: 5.4 10*3/uL (ref 3.6–11.0)

## 2018-08-02 LAB — BASIC METABOLIC PANEL
ANION GAP: 6 (ref 5–15)
BUN: 18 mg/dL (ref 8–23)
CHLORIDE: 110 mmol/L (ref 98–111)
CO2: 25 mmol/L (ref 22–32)
CREATININE: 1.01 mg/dL — AB (ref 0.44–1.00)
Calcium: 8.7 mg/dL — ABNORMAL LOW (ref 8.9–10.3)
GFR calc Af Amer: 53 mL/min — ABNORMAL LOW (ref >60)
GFR calc non Af Amer: 45 mL/min — ABNORMAL LOW (ref >60)
Glucose, Bld: 107 mg/dL — ABNORMAL HIGH (ref 70–99)
POTASSIUM: 4.1 mmol/L (ref 3.5–5.1)
SODIUM: 141 mmol/L (ref 135–145)

## 2018-08-02 LAB — BRAIN NATRIURETIC PEPTIDE: B NATRIURETIC PEPTIDE 5: 497 pg/mL — AB (ref 0.0–100.0)

## 2018-08-02 LAB — TROPONIN I

## 2018-08-02 MED ORDER — IPRATROPIUM-ALBUTEROL 20-100 MCG/ACT IN AERS: 1.0000 | INHALATION_SPRAY | Freq: Four times a day (QID) | RESPIRATORY_TRACT | 1 refills | Status: AC

## 2018-08-02 MED ORDER — FUROSEMIDE 20 MG PO TABS: 10.0000 mg | ORAL_TABLET | Freq: Every day | ORAL | 1 refills | Status: DC

## 2018-08-02 MED ORDER — FUROSEMIDE 20 MG PO TABS: 10.0000 mg | ORAL_TABLET | Freq: Every day | ORAL | 1 refills | Status: AC

## 2018-08-02 MED ORDER — FUROSEMIDE 20 MG PO TABS: 10.0000 mg | ORAL_TABLET | Freq: Every day | ORAL | 0 refills | Status: AC

## 2018-08-02 NOTE — ED Notes (Signed)
Repeat EKG performed per request of Dr. Jimmye Norman.

## 2018-08-02 NOTE — ED Triage Notes (Addendum)
Pt from home via EMS. Reports non productive cough, hx COPD and hypertension. Lungs clear per EMS. 136/65 95%RA. EMS reports placing pt on 2L 02 as pt was getting anxious about her cough and complaints of shortness of breath when she woke up.

## 2018-08-02 NOTE — ED Provider Notes (Signed)
[facility] Emergency Department Provider Note       Time seen: ----------------------------------------- 9:43 AM on 08/02/2018 -----------------------------------------   I have reviewed the triage vital signs and the nursing notes.  HISTORY   Chief Complaint Cough    HPI Kimberly Villarreal is a 84 y.o. female with a history of asthma, COPD, osteoarthritis, peptic ulcer disease who presents to the ED for nonproductive cough with palpitations.  Patient felt like her heart was beating forcefully and she had a hard cough that was nonproductive.  Patient arrived by EMS concerning same.  She states she does not typically have significant respiratory problems.  Past Medical History:  Diagnosis Date  . Asthma   . Bruises easily   . Complication of anesthesia   . COPD (chronic obstructive pulmonary disease) ([place])   . Emphysema lung ([place])   . GERD (gastroesophageal reflux disease)   . Glaucoma   . Hearing loss   . History of bladder problems   . Osteoarthritis   . Poor circulation   . PUD (peptic ulcer disease)   . Sinus disorder   . Venous stasis dermatitis     Patient Active Problem List   Diagnosis Date Noted  . GERD (gastroesophageal reflux disease) 05/26/2018  . Edema 05/26/2018  . Chronic cough 12/22/2015  . COPD type A ([place]) 05/06/2015    Past Surgical History:  Procedure Laterality Date  . ABDOMINAL HYSTERECTOMY    . ESOPHAGOGASTRODUODENOSCOPY (EGD) WITH PROPOFOL N/A 03/08/2016   Procedure: ESOPHAGOGASTRODUODENOSCOPY (EGD) WITH PROPOFOL;  Surgeon: Josefine Class, MD;  Location: [facility] ENDOSCOPY;  Service: Endoscopy;  Laterality: N/A;  . SURGERY OF LIP    . teeth pulled      Allergies Beta adrenergic blockers; Sulfa antibiotics; and Xalatan [latanoprost]  Social History Social History   Tobacco Use  . Smoking status: Former Research scientist (life sciences)  . Smokeless tobacco: Never Used  Substance Use Topics  . Alcohol use: No    Alcohol/week: 0.0  standard drinks  . Drug use: No   Review of Systems Constitutional: Negative for fever. Cardiovascular: Negative for chest pain.  Positive for palpitations Respiratory: Positive for shortness of breath and cough Gastrointestinal: Negative for abdominal pain, vomiting and diarrhea. Musculoskeletal: Negative for back pain. Skin: Negative for rash. Neurological: Negative for headaches, focal weakness or numbness.  All systems negative/normal/unremarkable except as stated in the HPI  ____________________________________________   PHYSICAL EXAM:  VITAL SIGNS: ED Triage Vitals  Enc Vitals Group     BP 08/02/18 0904 (!) 152/75     Pulse Rate 08/02/18 0904 81     Resp 08/02/18 0904 18     Temp 08/02/18 0904 98 F (36.7 C)     Temp Source 08/02/18 0904 Oral     SpO2 08/02/18 0904 95 %     Weight 08/02/18 0905 107 lb (48.5 kg)     Height 08/02/18 0905 5\' 1"  (1.549 m)     Head Circumference --      Peak Flow --      Pain Score 08/02/18 0904 0     Pain Loc --      Pain Edu? --      Excl. in [place]? --    Constitutional: Alert and oriented.  Anxious, no distress Eyes: Conjunctivae are normal. Normal extraocular movements. ENT   Head: Normocephalic and atraumatic.   Nose: No congestion/rhinnorhea.   Mouth/Throat: Mucous membranes are moist.   Neck: No stridor. Cardiovascular: Normal rate, regular rhythm. No murmurs, rubs,  or gallops. Respiratory: Normal respiratory effort without tachypnea nor retractions. Breath sounds are clear and equal bilaterally. No wheezes/rales/rhonchi. Gastrointestinal: Soft and nontender. Normal bowel sounds Musculoskeletal: Nontender with normal range of motion in extremities.  Bilateral edema is noted around the ankles Neurologic:  Normal speech and language. No gross focal neurologic deficits are appreciated.  Skin:  Skin is warm, dry and intact. No rash noted. Psychiatric: Mood and affect are normal. Speech and behavior are normal.   ____________________________________________  EKG: Interpreted by me.  Sinus rhythm the rate of 75 bpm, normal PR interval, normal QRS, normal QT  ____________________________________________  ED COURSE:  As part of my medical decision making, I reviewed the following data within the [place] History obtained from family if available, nursing notes, old chart and ekg, as well as notes from prior ED visits. Patient presented for cough and palpitations, we will assess with labs and imaging as indicated at this time.   Procedures ____________________________________________   LABS (pertinent positives/negatives)  Labs Reviewed  CBC WITH DIFFERENTIAL/PLATELET - Abnormal; Notable for the following components:      Result Value   RBC 3.79 (*)    All other components within normal limits  BASIC METABOLIC PANEL - Abnormal; Notable for the following components:   Glucose, Bld 107 (*)    Creatinine, Ser 1.01 (*)    Calcium 8.7 (*)    GFR calc non Af Amer 45 (*)    GFR calc Af Amer 53 (*)    All other components within normal limits  BRAIN NATRIURETIC PEPTIDE - Abnormal; Notable for the following components:   B Natriuretic Peptide 497.0 (*)    All other components within normal limits  TROPONIN I    RADIOLOGY Images were viewed by me  Chest x-ray FINDINGS: There is new slight pulmonary vascular congestion. No discrete infiltrates or effusions. Chronic marked hyperinflation of lungs. Minimal atelectasis at the left lung base.  Heart size is normal. Aortic atherosclerosis. Chronic accentuation of the thoracic kyphosis. No acute bone abnormality.  IMPRESSION: New slight pulmonary vascular prominence.  Aortic Atherosclerosis (ICD10-I70.0) and severe emphysema (ICD10-J43.9).  New slight atelectasis at the left lung base.  ____________________________________________  DIFFERENTIAL DIAGNOSIS   Anxiety, COPD, pneumonia, pneumothorax, PE  FINAL  ASSESSMENT AND PLAN  Cough, palpitations   Plan: The patient had presented for symptoms which are likely anxiety related. Patient's labs did not reveal any acute process although her BNP was slightly elevated. Patient's imaging revealed some pulmonary vascular prominence.  This could be the beginnings of congestive heart failure.  Currently she is feeling better, I will try low-dose Lasix for her to see if this helps but otherwise she is cleared for outpatient follow-up and feels well.  There is also an anxiety component to her symptoms.   Laurence Aly, MD   Note: This note was generated in part or whole with voice recognition software. Voice recognition is usually quite accurate but there are transcription errors that can and very often do occur. I apologize for any typographical errors that were not detected and corrected.     Earleen Newport, MD 08/02/18 [phone]

## 2018-09-14 ENCOUNTER — Ambulatory Visit
Admission: EM | Admit: 2018-09-14 | Discharge: 2018-09-14 | Disposition: A | Discharge status: Home / Self Care | Payer: Medicare Other | Attending: Family Medicine | Admitting: Family Medicine

## 2018-09-14 ENCOUNTER — Other Ambulatory Visit: Payer: Self-pay

## 2018-09-14 ENCOUNTER — Encounter: Payer: Self-pay | Admitting: Emergency Medicine

## 2018-09-14 DIAGNOSIS — I878 Other specified disorders of veins: Secondary | ICD-10-CM | POA: Diagnosis not present

## 2018-09-14 NOTE — ED Provider Notes (Signed)
[facility]    CSN: [id number] Arrival date & time: 09/14/18  1146  History   Chief Complaint Chief Complaint  Patient presents with  . Leg Injury    left   HPI  84 year old female with a history of venous stasis presents with complaints of leaking fluid from her left lower leg.  Patient has a long-standing history of venous stasis.  She is now unable to wear compression stockings as she has severe arthritis in her hands and is too painful for her to do so.  Patient reports that she has 2-3 small open areas that are leaking of fluid.  The fluid is clear.  She has no reports of shortness of breath at this time.  No known exacerbating relieving factors.  No other reported symptoms.  No other complaints.  PMH, Surgical Hx, Family Hx, Social History reviewed and updated as below.  Past Medical History:  Diagnosis Date  . Asthma   . Bruises easily   . Complication of anesthesia   . COPD (chronic obstructive pulmonary disease) ([place])   . Emphysema lung ([place])   . GERD (gastroesophageal reflux disease)   . Glaucoma   . Hearing loss   . History of bladder problems   . Osteoarthritis   . Poor circulation   . PUD (peptic ulcer disease)   . Sinus disorder   . Venous stasis dermatitis     Patient Active Problem List   Diagnosis Date Noted  . GERD (gastroesophageal reflux disease) 05/26/2018  . Edema 05/26/2018  . Chronic cough 12/22/2015  . COPD type A ([place]) 05/06/2015    Past Surgical History:  Procedure Laterality Date  . ABDOMINAL HYSTERECTOMY    . ESOPHAGOGASTRODUODENOSCOPY (EGD) WITH PROPOFOL N/A 03/08/2016   Procedure: ESOPHAGOGASTRODUODENOSCOPY (EGD) WITH PROPOFOL;  Surgeon: Josefine Class, MD;  Location: [facility] END[facility]COPY;  Service: Endoscopy;  Laterality: N/A;  . SURGERY OF LIP    . teeth pulled      OB History   None      Home Medications    Prior to Admission medications   Medication Sig Start Date End Date Taking? Authorizing Provider    albuterol (VENTOLIN HFA) 108 (90 BASE) MCG/ACT inhaler Inhale 108 mcg into the lungs every 6 (six) hours as needed for wheezing or shortness of breath.     [provider]  brimonidine (ALPHAGAN) 0.2 % ophthalmic solution Place 1 drop into both eyes daily. 09/09/14   [provider]  Brinzolamide-Brimonidine [facility]) 1-0.2 % SUSP instill 1 drop into right eye twice a day 05/21/15   [provider]  carboxymethylcellulose (REFRESH PLUS) 0.5 % SOLN Apply 1-2 drops to eye daily as needed (dry eyes).     [provider]  cetirizine (ZYRTEC) 10 MG tablet Take 10 mg by mouth daily.    [provider]  chlorthalidone (HYGROTON) 25 MG tablet Take by mouth. 05/08/18 05/08/19  [provider]  clotrimazole (MYCELEX) 10 MG troche Take 1 tablet (10 mg total) by mouth 5 (five) times daily. 05/27/17   Frederich Cha, MD  DHA-EPA-Flaxseed Oil-Vitamin E [facility] NUTRITION) CAPS Take by mouth.    [provider]  furosemide (LASIX) 20 MG tablet Take 0.5 tablets (10 mg total) by mouth daily. 08/02/18 08/02/19  Earleen Newport, MD  furosemide (LASIX) 20 MG tablet Take 0.5 tablets (10 mg total) by mouth daily. 08/02/18 08/02/19  Earleen Newport, MD  gabapentin (NEURONTIN) 100 MG capsule Take 100-200 mg by mouth at bedtime.  [provider]  Ipratropium-Albuterol (COMBIVENT RESPIMAT) 20-100 MCG/ACT AERS respimat Inhale 1 puff into the lungs every 6 (six) hours. 08/02/18   Earleen Newport, MD  lisinopril (PRINIVIL,ZESTRIL) 5 MG tablet Take 5 mg by mouth daily. 07/18/18   [provider]  mometasone (ELOCON) 0.1 % cream Apply topically. 05/08/18 05/08/19  [provider]  montelukast (SINGULAIR) 10 MG tablet Take 10 mg by mouth at bedtime.    [provider]  naproxen sodium (ALEVE) 220 MG tablet Take by mouth.    [provider]  oxybutynin (DITROPAN) 5 MG tablet Take 5 mg by mouth 3 (three) times daily  between meals as needed (overactive bladder).     [provider]  pantoprazole (PROTONIX) 40 MG tablet Take 40 mg by mouth daily.    [provider]  PROCTOSOL HC 2.5 % rectal cream Place 1 application rectally 3 (three) times daily as needed for itching. 07/12/18   [provider]    Family History Family History  Problem Relation Age of Onset  . Coronary artery disease Mother   . Coronary artery disease Father   . Stroke Father   . Stroke Sister   . Stroke Daughter   . Anxiety disorder Daughter   . Depression Daughter   . Skin cancer Daughter     Social History Social History   Tobacco Use  . Smoking status: Former Research scientist (life sciences)  . Smokeless tobacco: Never Used  Substance Use Topics  . Alcohol use: No    Alcohol/week: 0.0 standard drinks  . Drug use: No     Allergies   Beta adrenergic blockers; Sulfa antibiotics; and Xalatan [latanoprost]   Review of Systems Review of Systems  Constitutional: Negative.   Cardiovascular: Positive for leg swelling.   Physical Exam Triage Vital Signs ED Triage Vitals  Enc Vitals Group     BP 09/14/18 1155 (!) 146/73     Pulse Rate 09/14/18 1155 66     Resp 09/14/18 1155 16     Temp 09/14/18 1155 97.6 F (36.4 C)     Temp Source 09/14/18 1155 Oral     SpO2 09/14/18 1155 98 %     Weight 09/14/18 1154 107 lb (48.5 kg)     Height 09/14/18 1154 5\' 1"  (1.549 m)     Head Circumference --      Peak Flow --      Pain Score 09/14/18 1154 0     Pain Loc --      Pain Edu? --      Excl. in [place]? --    Updated Vital Signs BP (!) 146/73 (BP Location: Left Arm)   Pulse 66   Temp 97.6 F (36.4 C) (Oral)   Resp 16   Ht 5\' 1"  (1.549 m)   Wt 48.5 kg   SpO2 98%   BMI 20.22 kg/m   Visual Acuity Right Eye Distance:   Left Eye Distance:   Bilateral Distance:    Right Eye Near:   Left Eye Near:    Bilateral Near:     Physical Exam  Constitutional: She is oriented to person, place, and time. She appears  well-developed. No distress.  HENT:  Head: Normocephalic and atraumatic.  Cardiovascular:  Lower extremities with 2-3+ pitting edema.  Patient has a punctate small open area which is leaking clear fluid.  Pulmonary/Chest: Effort normal. No respiratory distress.  Neurological: She is alert and oriented to person, place, and time.  Psychiatric: She has a  normal mood and affect. Her behavior is normal.  Nursing note and vitals reviewed.  UC Treatments / Results  Labs (all labs ordered are listed, but only abnormal results are displayed) Labs Reviewed - No data to display  EKG None  Radiology No results found.  Procedures Procedures (including critical care time)  Medications Ordered in UC Medications - No data to display  Initial Impression / Assessment and Plan / UC Course  I have reviewed the triage vital signs and the nursing notes.  Pertinent labs & imaging results that were available during my care of the patient were reviewed by me and considered in my medical decision making (see chart for details).    84 year old female presents with leakage of fluid secondary to severe venous stasis.  No evidence of infection.  Patient unable to use compression stockings.  Advised elevation.  Also advised to discuss pneumatic compression with her primary care physician.  Final Clinical Impressions(s) / UC Diagnoses   Final diagnoses:  Venous stasis     Discharge Instructions     Elevate your legs often.  Consider pneumatic compression with your doctor.   No evidence of infection.  Take care  Dr. Lacinda Axon     ED Prescriptions    None     Controlled Substance Prescriptions  Controlled Substance Registry consulted? Not Applicable   Coral Spikes, DO 09/14/18 1220

## 2018-09-14 NOTE — Discharge Instructions (Signed)
Elevate your legs often.  Consider pneumatic compression with your doctor.   No evidence of infection.  Take care  Dr. Lacinda Axon

## 2018-09-14 NOTE — ED Triage Notes (Addendum)
Patient states that last night she noticed some drainage from 2 spots on her left lower leg.  Patient denies any pain.

## 2019-08-07 DIAGNOSIS — G2581 Restless legs syndrome: Secondary | ICD-10-CM | POA: Insufficient documentation

## 2019-08-30 ENCOUNTER — Other Ambulatory Visit: Payer: Self-pay | Admitting: Student

## 2019-08-30 DIAGNOSIS — R630 Anorexia: Secondary | ICD-10-CM

## 2019-08-30 DIAGNOSIS — K5900 Constipation, unspecified: Secondary | ICD-10-CM

## 2019-08-30 DIAGNOSIS — R140 Abdominal distension (gaseous): Secondary | ICD-10-CM

## 2019-09-04 ENCOUNTER — Telehealth (INDEPENDENT_AMBULATORY_CARE_PROVIDER_SITE_OTHER): Payer: Self-pay | Admitting: Vascular Surgery

## 2019-09-04 NOTE — Telephone Encounter (Signed)
Patient was advise with medical recommendations and stated she will utilize the compression stockings and call if needed.

## 2019-09-04 NOTE — Telephone Encounter (Signed)
Please see below.

## 2019-09-04 NOTE — Telephone Encounter (Signed)
The redness could be related to an infection, which I would continue to let your PCP treat since they were the original ones to treat.  The swelling could be worsened by the infection.  Elevation should be done regularly, not just three times a day.  She should do so whenever sitting.  The patient should also utilize compression socks to help with the swelling.  We can bring the patient in to see me or dew in the next week or so if she wishes with me or dew

## 2019-09-05 ENCOUNTER — Other Ambulatory Visit: Payer: Self-pay

## 2019-09-05 ENCOUNTER — Ambulatory Visit
Admission: RE | Admit: 2019-09-05 | Discharge: 2019-09-05 | Disposition: A | Discharge status: Home / Self Care | Payer: Medicare Other | Source: Ambulatory Visit | Attending: Student | Admitting: Student

## 2019-09-05 DIAGNOSIS — R140 Abdominal distension (gaseous): Secondary | ICD-10-CM

## 2019-09-05 DIAGNOSIS — R630 Anorexia: Secondary | ICD-10-CM

## 2019-09-05 DIAGNOSIS — K5900 Constipation, unspecified: Secondary | ICD-10-CM | POA: Diagnosis present

## 2019-11-19 ENCOUNTER — Telehealth (INDEPENDENT_AMBULATORY_CARE_PROVIDER_SITE_OTHER): Payer: Self-pay | Admitting: Vascular Surgery

## 2019-11-19 NOTE — Telephone Encounter (Signed)
I would recommend that the patient reach out to her PCP first.  This is because she has a history of congestive heart failure and generally swelling that is present above the knee is more consistent with a CHF exacerbation vs venous related leg swelling.  In addition, the weeping and blisters are also consistent with fluid overload status.  This is generally managed with diuretics which we do not typically manage in our office, so that is why contacting the PCP is very important.  This is also because treating the cause of the swelling is far more important than the actual swelling, because this could lead to a CHF exacerbation. Once she has seen her PCP we can bring her in for Unna wraps

## 2019-11-19 NOTE — Telephone Encounter (Signed)
I spoke with patient daughter to see when was the last time her mother had been seen by her PCP and she informed that her mother saw her PCP in November. The patient daughter stated that the provider advise her mother to elevate and wear compression stockings but her mother has not been able to put on compression stockings.The patient is schedule to be seen by Dr Tamala Julian February 2021 and I advise the daughter to contact their office to get a earlier appointment. The patient stated that the weeping has been going on for 3 weeks and her legs feel like concrete. The patient daughter was giving medical advice and verbalized understanding.

## 2019-11-29 ENCOUNTER — Encounter (INDEPENDENT_AMBULATORY_CARE_PROVIDER_SITE_OTHER): Payer: Medicare Other

## 2019-11-29 ENCOUNTER — Telehealth (INDEPENDENT_AMBULATORY_CARE_PROVIDER_SITE_OTHER): Payer: Self-pay | Admitting: Vascular Surgery

## 2019-11-29 NOTE — Telephone Encounter (Signed)
We can reschedule, that's fine

## 2019-11-29 NOTE — Telephone Encounter (Signed)
PATIENT WAS SCHEDULED TODAY 11/29/19 FOR UNNA WRAPS BUT CALLED BACK STATING SHE COULDN'T COME TODAY DUE TO MEDICATION SHE'S ON KEEPING HER IN THE RESTROOM. ASKED ME TO RESCHEDULE FOR HER.

## 2019-12-04 ENCOUNTER — Ambulatory Visit (INDEPENDENT_AMBULATORY_CARE_PROVIDER_SITE_OTHER): Payer: Medicare Other | Admitting: Nurse Practitioner

## 2019-12-04 ENCOUNTER — Other Ambulatory Visit: Payer: Self-pay

## 2019-12-04 ENCOUNTER — Encounter (INDEPENDENT_AMBULATORY_CARE_PROVIDER_SITE_OTHER): Payer: Self-pay | Admitting: Nurse Practitioner

## 2019-12-04 VITALS — BP 141/63 | HR 68 | Resp 12 | Ht 60.0 in | Wt 107.0 lb

## 2019-12-04 DIAGNOSIS — R600 Localized edema: Secondary | ICD-10-CM

## 2019-12-04 NOTE — Progress Notes (Signed)
History of Present Illness  There is no documented history at this time  Assessments & Plan   There are no diagnoses linked to this encounter.    Additional instructions  Subjective:  Patient presents with venous ulcer of the Bilateral lower extremity.    Procedure:  3 layer unna wrap was placed Bilateral lower extremity.   Plan:   Follow up in one week.  

## 2019-12-11 ENCOUNTER — Encounter (INDEPENDENT_AMBULATORY_CARE_PROVIDER_SITE_OTHER): Payer: Self-pay

## 2019-12-11 ENCOUNTER — Ambulatory Visit (INDEPENDENT_AMBULATORY_CARE_PROVIDER_SITE_OTHER): Payer: Medicare Other | Admitting: Nurse Practitioner

## 2019-12-11 ENCOUNTER — Other Ambulatory Visit: Payer: Self-pay

## 2019-12-11 VITALS — BP 137/71 | HR 80 | Resp 15 | Wt 102.8 lb

## 2019-12-11 DIAGNOSIS — R600 Localized edema: Secondary | ICD-10-CM | POA: Diagnosis not present

## 2019-12-11 NOTE — Progress Notes (Signed)
History of Present Illness  There is no documented history at this time  Assessments & Plan   There are no diagnoses linked to this encounter.    Additional instructions  Subjective:  Patient presents with venous ulcer of the Bilateral lower extremity.    Procedure:  3 layer unna wrap was placed Bilateral lower extremity.   Plan:   Follow up in one week.  

## 2019-12-12 ENCOUNTER — Encounter (INDEPENDENT_AMBULATORY_CARE_PROVIDER_SITE_OTHER): Payer: Self-pay | Admitting: Nurse Practitioner

## 2019-12-18 ENCOUNTER — Encounter (INDEPENDENT_AMBULATORY_CARE_PROVIDER_SITE_OTHER): Payer: Self-pay

## 2019-12-18 ENCOUNTER — Other Ambulatory Visit: Payer: Self-pay

## 2019-12-18 ENCOUNTER — Ambulatory Visit (INDEPENDENT_AMBULATORY_CARE_PROVIDER_SITE_OTHER): Payer: Medicare Other | Admitting: Nurse Practitioner

## 2019-12-18 VITALS — BP 133/69 | HR 82 | Resp 16 | Wt 104.0 lb

## 2019-12-18 DIAGNOSIS — R600 Localized edema: Secondary | ICD-10-CM

## 2019-12-18 NOTE — Progress Notes (Signed)
History of Present Illness  There is no documented history at this time  Assessments & Plan   There are no diagnoses linked to this encounter.    Additional instructions  Subjective:  Patient presents with venous ulcer of the Bilateral lower extremity.    Procedure:  3 layer unna wrap was placed Bilateral lower extremity.   Plan:   Follow up in one week.  

## 2019-12-19 ENCOUNTER — Encounter (INDEPENDENT_AMBULATORY_CARE_PROVIDER_SITE_OTHER): Payer: Self-pay | Admitting: Nurse Practitioner

## 2019-12-25 ENCOUNTER — Other Ambulatory Visit: Payer: Self-pay

## 2019-12-25 ENCOUNTER — Ambulatory Visit (INDEPENDENT_AMBULATORY_CARE_PROVIDER_SITE_OTHER): Payer: Medicare Other | Admitting: Nurse Practitioner

## 2019-12-25 VITALS — BP 159/74 | HR 80 | Resp 16 | Ht 60.0 in | Wt 108.0 lb

## 2019-12-25 DIAGNOSIS — R600 Localized edema: Secondary | ICD-10-CM

## 2019-12-25 NOTE — Progress Notes (Signed)
History of Present Illness  There is no documented history at this time  Assessments & Plan   There are no diagnoses linked to this encounter.    Additional instructions  Subjective:  Patient presents with venous ulcer of the Bilateral lower extremity.    Procedure:  3 layer unna wrap was placed Bilateral lower extremity.   Plan:   Follow up in one week.  

## 2019-12-26 ENCOUNTER — Encounter (INDEPENDENT_AMBULATORY_CARE_PROVIDER_SITE_OTHER): Payer: Self-pay | Admitting: Nurse Practitioner

## 2020-01-01 ENCOUNTER — Ambulatory Visit (INDEPENDENT_AMBULATORY_CARE_PROVIDER_SITE_OTHER): Payer: Medicare Other | Admitting: Nurse Practitioner

## 2020-01-01 ENCOUNTER — Encounter (INDEPENDENT_AMBULATORY_CARE_PROVIDER_SITE_OTHER): Payer: Self-pay | Admitting: Nurse Practitioner

## 2020-01-01 ENCOUNTER — Other Ambulatory Visit: Payer: Self-pay

## 2020-01-01 VITALS — BP 125/75 | Resp 12 | Ht 60.0 in | Wt 104.0 lb

## 2020-01-01 DIAGNOSIS — I890 Lymphedema, not elsewhere classified: Secondary | ICD-10-CM

## 2020-01-01 DIAGNOSIS — M810 Age-related osteoporosis without current pathological fracture: Secondary | ICD-10-CM | POA: Insufficient documentation

## 2020-01-01 DIAGNOSIS — G3184 Mild cognitive impairment, so stated: Secondary | ICD-10-CM | POA: Diagnosis not present

## 2020-01-02 ENCOUNTER — Encounter (INDEPENDENT_AMBULATORY_CARE_PROVIDER_SITE_OTHER): Payer: Self-pay | Admitting: Nurse Practitioner

## 2020-01-02 ENCOUNTER — Telehealth (INDEPENDENT_AMBULATORY_CARE_PROVIDER_SITE_OTHER): Payer: Self-pay

## 2020-01-02 NOTE — Progress Notes (Signed)
SUBJECTIVE:  Patient ID: Kimberly Villarreal, female    DOB: 09/29/1920, 86 y.o.   MRN: [id number] Chief Complaint  Patient presents with  . Follow-up    BIL Unna Check    HPI  Kimberly Villarreal is a 86 y.o. female presents today after 4 weeks of Unna wraps.  The patient has had a current reduction in swelling as well as no longer has any weeping or blisters.  However, the patient does still have prominent edema near her feet and ankle area.  The patient does endorse elevating her lower extremities however she states that she can only do it 2-3 times a day for 30 minutes at a time.  She is currently working on packing because she plans on moving to [place] in approximately 7 weeks or so.  Per previous conversation with the patient she has obtained some medical grade 1 compression stockings with the zipper that may be a little bit easier for her to place on her legs.  We also discussed compression wraps which may be even more comfortable.  She denies any fever, chills, nausea, vomiting or diarrhea.  She denies any TIA-like symptoms.  She denies any new wounds or ulcerations.  Past Medical History:  Diagnosis Date  . Asthma   . Bruises easily   . Complication of anesthesia   . COPD (chronic obstructive pulmonary disease) ([place])   . Emphysema lung ([place])   . GERD (gastroesophageal reflux disease)   . Glaucoma   . Hearing loss   . History of bladder problems   . Osteoarthritis   . Poor circulation   . PUD (peptic ulcer disease)   . Sinus disorder   . Venous stasis dermatitis     Past Surgical History:  Procedure Laterality Date  . ABDOMINAL HYSTERECTOMY    . ESOPHAGOGASTRODUODENOSCOPY (EGD) WITH PROPOFOL N/A 03/08/2016   Procedure: ESOPHAGOGASTRODUODENOSCOPY (EGD) WITH PROPOFOL;  Surgeon: Josefine Class, MD;  Location: [facility] ENDOSCOPY;  Service: Endoscopy;  Laterality: N/A;  . SURGERY OF LIP    . teeth pulled      Social History   Socioeconomic History  . Marital status: Single    Spouse name: Not on file  . Number of children: Not on file  . Years of education: Not on file  . Highest education level: Not on file  Occupational History  . Not on file  Tobacco Use  . Smoking status: Former Research scientist (life sciences)  . Smokeless tobacco: Never Used  Substance and Sexual Activity  . Alcohol use: No    Alcohol/week: 0.0 standard drinks  . Drug use: No  . Sexual activity: Not on file  Other Topics Concern  . Not on file  Social History Narrative  . Not on file   Social Determinants of Health   Financial Resource Strain:   . Difficulty of Paying Living Expenses: Not on file  Food Insecurity:   . Worried About Charity fundraiser in the Last Year: Not on file  . Ran Out of Food in the Last Year: Not on file  Transportation Needs:   . Lack of Transportation (Medical): Not on file  . Lack of Transportation (Non-Medical): Not on file  Physical Activity:   . Days of Exercise per Week: Not on file  . Minutes of Exercise per Session: Not on file  Stress:   . Feeling of Stress : Not on file  Social Connections:   . Frequency of Communication with Friends and Family: Not on file  .  Frequency of Social Gatherings with Friends and Family: Not on file  . Attends Religious Services: Not on file  . Active Member of Clubs or Organizations: Not on file  . Attends Archivist Meetings: Not on file  . Marital Status: Not on file  Intimate Partner Violence:   . Fear of Current or Ex-Partner: Not on file  . Emotionally Abused: Not on file  . Physically Abused: Not on file  . Sexually Abused: Not on file    Family History  Problem Relation Age of Onset  . Coronary artery disease Mother   . Coronary artery disease Father   . Stroke Father   . Stroke Sister   . Stroke Daughter   . Anxiety disorder Daughter   . Depression Daughter   . Skin cancer Daughter     Allergies  Allergen Reactions  . Beta Adrenergic Blockers Other (See Comments)    unknown  . Sulfa Antibiotics  Other (See Comments)    Mouth sores  . Xalatan [Latanoprost] Other (See Comments)    unknown     Review of Systems   Review of Systems: Negative Unless Checked Constitutional: [] Weight loss  [] Fever  [] Chills Cardiac: [] Chest pain   []  Atrial Fibrillation  [] Palpitations   [] Shortness of breath when laying flat   [] Shortness of breath with exertion. [] Shortness of breath at rest Vascular:  [] Pain in legs with walking   [] Pain in legs with standing [] Pain in legs when laying flat   [] Claudication    [] Pain in feet when laying flat    [] History of DVT   [] Phlebitis   [x] Swelling in legs   [x] Varicose veins   [] Non-healing ulcers Pulmonary:   [] Uses home oxygen   [] Productive cough   [] Hemoptysis   [] Wheeze  [x] COPD   [x] Asthma Neurologic:  [] Dizziness   [] Seizures  [] Blackouts [] History of stroke   [] History of TIA  [] Aphasia   [] Temporary Blindness   [] Weakness or numbness in arm   [] Weakness or numbness in leg Musculoskeletal:   [] Joint swelling   [] Joint pain   [] Low back pain  []  History of Knee Replacement [] Arthritis [] back Surgeries  []  Spinal Stenosis    Hematologic:  [] Easy bruising  [] Easy bleeding   [] Hypercoagulable state   [] Anemic Gastrointestinal:  [] Diarrhea   [] Vomiting  [x] Gastroesophageal reflux/heartburn   [] Difficulty swallowing. [] Abdominal pain Genitourinary:  [] Chronic kidney disease   [] Difficult urination  [] Anuric   [] Blood in urine [] Frequent urination  [] Burning with urination   [] Hematuria Skin:  [] Rashes   [] Ulcers [] Wounds Psychological:  [] History of anxiety   []  History of major depression  []  Memory Difficulties      OBJECTIVE:   Physical Exam  BP 125/75 (BP Location: Right Arm)   Resp 12   Ht 5' (1.524 m)   Wt 104 lb (47.2 kg)   BMI 20.31 kg/m   Gen: WD/WN, NAD Head: [place]/AT, No temporalis wasting.  Ear/Nose/Throat: Hearing grossly intact, nares w/o erythema or drainage Eyes: PER, EOMI, sclera nonicteric.  Neck: Supple, no masses.  No JVD.    Pulmonary:  Good air movement, no use of accessory muscles.  Cardiac: RRR Vascular:  3+ edema right ankle, 2+ left Vessel Right Left  Radial Palpable Palpable  Dorsalis Pedis Palpable Palpable  Posterior Tibial Palpable Palpable   Gastrointestinal: soft, non-distended. No guarding/no peritoneal signs.  Musculoskeletal: M/S 5/5 throughout.  No deformity or atrophy.  Neurologic: Pain and light touch intact in extremities.  Symmetrical.  Speech is fluent.  Motor exam as listed above. Psychiatric: Judgment intact, Mood & affect appropriate for pt's clinical situation. Dermatologic: No Venous rashes. No Ulcers Noted.  No changes consistent with cellulitis. Lymph : No Cervical lymphadenopathy, no lichenification or skin changes of chronic lymphedema.       ASSESSMENT AND PLAN:  1. Lymphedema Had a long discussion with the patient regarding lower extremity edema as well as conservative therapy.  We discussed different methods of compression therapy which will be essential for controlling her edema.  The patient does have a pair of medical grade 1 compression stockings and office staff did help her place them today prior to her leaving.  Elevation of her lower extremities was emphasized, more than just 2-3 times per day.  The patient should be elevating her lower extremities anytime that she sits to take a break.  She should also exercise when possible.  The patient did not wish to have Unna wraps placed again today.  The patient is advised that if she should have swelling or blistering again prior to her move would be happy to see her back in office to place Unna wraps.  I discussed lymph pump with the patient and the patient did not want to do that as she felt it was "too much of a bother".  Otherwise the patient should continue with prescribed conservative therapy.  2. Mild cognitive impairment This will make adherence to therapy difficult.  Patient did not have family members with  her.   Current Outpatient Medications on File Prior to Visit  Medication Sig Dispense Refill  . brimonidine (ALPHAGAN) 0.2 % ophthalmic solution Place 1 drop into both eyes daily.    . Brinzolamide-Brimonidine (SIMBRINZA) 1-0.2 % SUSP instill 1 drop into right eye twice a day    . carboxymethylcellulose (REFRESH PLUS) 0.5 % SOLN Apply 1-2 drops to eye daily as needed (dry eyes).     . DHA-EPA-Flaxseed Oil-Vitamin E (THERATEARS NUTRITION) CAPS Take by mouth.    . gabapentin (NEURONTIN) 100 MG capsule Take 100-200 mg by mouth at bedtime.    . montelukast (SINGULAIR) 10 MG tablet Take 10 mg by mouth at bedtime.    . naproxen sodium (ALEVE) 220 MG tablet Take by mouth.    . oxybutynin (DITROPAN) 5 MG tablet Take 5 mg by mouth 3 (three) times daily between meals as needed (overactive bladder).     . pantoprazole (PROTONIX) 40 MG tablet Take 40 mg by mouth daily.    Marland Kitchen PROCTOSOL HC 2.5 % rectal cream Place 1 application rectally 3 (three) times daily as needed for itching.  1  . albuterol (VENTOLIN HFA) 108 (90 BASE) MCG/ACT inhaler Inhale 108 mcg into the lungs every 6 (six) hours as needed for wheezing or shortness of breath.     . cetirizine (ZYRTEC) 10 MG tablet Take 10 mg by mouth daily.    . chlorthalidone (HYGROTON) 25 MG tablet Take by mouth.    . clotrimazole (MYCELEX) 10 MG troche Take 1 tablet (10 mg total) by mouth 5 (five) times daily. (Patient not taking: Reported on 01/01/2020) 70 tablet 0  . furosemide (LASIX) 20 MG tablet Take 0.5 tablets (10 mg total) by mouth daily. 15 tablet 1  . furosemide (LASIX) 20 MG tablet Take 0.5 tablets (10 mg total) by mouth daily. 14 tablet 0  . Ipratropium-Albuterol (COMBIVENT RESPIMAT) 20-100 MCG/ACT AERS respimat Inhale 1 puff into the lungs every 6 (six) hours. (Patient not taking: Reported on 01/01/2020) 1 Inhaler 1  . lisinopril (  PRINIVIL,ZESTRIL) 5 MG tablet Take 5 mg by mouth daily.  2   No current facility-administered medications on file prior to  visit.    There are no Patient Instructions on file for this visit. No follow-ups on file.   Kris Hartmann, NP  This note was completed with Sales executive.  Any errors are purely unintentional.

## 2020-01-02 NOTE — Telephone Encounter (Signed)
Bring her in for wraps

## 2020-01-02 NOTE — Telephone Encounter (Signed)
Patient is schedule to come in 01/03/20@ 1:30

## 2020-01-03 ENCOUNTER — Ambulatory Visit (INDEPENDENT_AMBULATORY_CARE_PROVIDER_SITE_OTHER): Payer: Medicare Other | Admitting: Nurse Practitioner

## 2020-01-03 ENCOUNTER — Other Ambulatory Visit: Payer: Self-pay

## 2020-01-03 ENCOUNTER — Encounter (INDEPENDENT_AMBULATORY_CARE_PROVIDER_SITE_OTHER): Payer: Self-pay | Admitting: Nurse Practitioner

## 2020-01-03 VITALS — BP 134/70 | HR 70 | Resp 12 | Ht 60.0 in | Wt 106.0 lb

## 2020-01-03 DIAGNOSIS — R600 Localized edema: Secondary | ICD-10-CM

## 2020-01-03 NOTE — Progress Notes (Signed)
History of Present Illness  There is no documented history at this time  Assessments & Plan   There are no diagnoses linked to this encounter.    Additional instructions  Subjective:  Patient presents with venous ulcer of the Bilateral lower extremity.    Procedure:  3 layer unna wrap was placed Bilateral lower extremity.   Plan:   Follow up in one week.  

## 2020-01-07 ENCOUNTER — Other Ambulatory Visit: Payer: Self-pay

## 2020-01-07 ENCOUNTER — Ambulatory Visit (INDEPENDENT_AMBULATORY_CARE_PROVIDER_SITE_OTHER): Payer: Medicare Other | Admitting: Nurse Practitioner

## 2020-01-07 ENCOUNTER — Encounter (INDEPENDENT_AMBULATORY_CARE_PROVIDER_SITE_OTHER): Payer: Self-pay

## 2020-01-07 VITALS — BP 128/77 | HR 75 | Resp 14 | Wt 104.0 lb

## 2020-01-07 DIAGNOSIS — R600 Localized edema: Secondary | ICD-10-CM

## 2020-01-07 NOTE — Progress Notes (Signed)
History of Present Illness  There is no documented history at this time  Assessments & Plan   There are no diagnoses linked to this encounter.    Additional instructions  Subjective:  Patient presents with venous ulcer of the Bilateral lower extremity.    Procedure:  3 layer unna wrap was placed Bilateral lower extremity.   Plan:   Follow up in one week.  

## 2020-01-08 ENCOUNTER — Encounter (INDEPENDENT_AMBULATORY_CARE_PROVIDER_SITE_OTHER): Payer: Medicare Other

## 2020-01-09 ENCOUNTER — Encounter (INDEPENDENT_AMBULATORY_CARE_PROVIDER_SITE_OTHER): Payer: Medicare Other

## 2020-01-15 ENCOUNTER — Ambulatory Visit (INDEPENDENT_AMBULATORY_CARE_PROVIDER_SITE_OTHER): Payer: Medicare Other | Admitting: Nurse Practitioner

## 2020-01-15 ENCOUNTER — Encounter (INDEPENDENT_AMBULATORY_CARE_PROVIDER_SITE_OTHER): Payer: Self-pay

## 2020-01-15 ENCOUNTER — Other Ambulatory Visit: Payer: Self-pay

## 2020-01-15 VITALS — BP 127/65 | HR 77 | Resp 16 | Ht 60.0 in | Wt 106.0 lb

## 2020-01-15 DIAGNOSIS — R600 Localized edema: Secondary | ICD-10-CM

## 2020-01-22 ENCOUNTER — Other Ambulatory Visit: Payer: Self-pay

## 2020-01-22 ENCOUNTER — Encounter (INDEPENDENT_AMBULATORY_CARE_PROVIDER_SITE_OTHER): Payer: Self-pay

## 2020-01-22 ENCOUNTER — Ambulatory Visit (INDEPENDENT_AMBULATORY_CARE_PROVIDER_SITE_OTHER): Payer: Medicare Other | Admitting: Nurse Practitioner

## 2020-01-22 VITALS — BP 117/57 | HR 69 | Resp 14 | Ht 62.0 in | Wt 108.0 lb

## 2020-01-22 DIAGNOSIS — R600 Localized edema: Secondary | ICD-10-CM

## 2020-01-29 ENCOUNTER — Ambulatory Visit (INDEPENDENT_AMBULATORY_CARE_PROVIDER_SITE_OTHER): Payer: Medicare Other | Admitting: Nurse Practitioner

## 2020-01-29 ENCOUNTER — Encounter (INDEPENDENT_AMBULATORY_CARE_PROVIDER_SITE_OTHER): Payer: Self-pay | Admitting: Nurse Practitioner

## 2020-01-29 ENCOUNTER — Other Ambulatory Visit: Payer: Self-pay

## 2020-01-29 VITALS — BP 123/66 | HR 71 | Resp 16 | Ht 59.0 in | Wt 107.0 lb

## 2020-01-29 DIAGNOSIS — I890 Lymphedema, not elsewhere classified: Secondary | ICD-10-CM | POA: Diagnosis not present

## 2020-01-29 DIAGNOSIS — G3184 Mild cognitive impairment, so stated: Secondary | ICD-10-CM | POA: Diagnosis not present

## 2020-02-04 ENCOUNTER — Encounter (INDEPENDENT_AMBULATORY_CARE_PROVIDER_SITE_OTHER): Payer: Self-pay | Admitting: Nurse Practitioner

## 2020-02-04 NOTE — Progress Notes (Signed)
SUBJECTIVE:  Patient ID: Kimberly Villarreal, female    DOB: 08-20-1920, 86 y.o.   MRN: [id number] Chief Complaint  Patient presents with  . Follow-up    BIL [facility] WRAP     HPI  Kimberly Villarreal is a 86 y.o. female that presents today for interim check.  Today the patient still has some markedly swollen legs.  This is more related to CHF versus her lymphedema.  Also the patient is not very compliant with leg elevation.  The patient states that she is too busy preparing to move to [place].  She states that she elevates her legs 3 times a day for 30 minutes or so.  The patient has been compliant with wearing her own wraps.  She denies any fever, chills, nausea, vomiting or diarrhea.  There are no open wounds or ulcerations today.  There are no blisters today.  The patient will be moving in approximately 2 weeks to [place].  Past Medical History:  Diagnosis Date  . Asthma   . Bruises easily   . Complication of anesthesia   . COPD (chronic obstructive pulmonary disease) ([place])   . Emphysema lung ([place])   . GERD (gastroesophageal reflux disease)   . Glaucoma   . Hearing loss   . History of bladder problems   . Osteoarthritis   . Poor circulation   . PUD (peptic ulcer disease)   . Sinus disorder   . Venous stasis dermatitis     Past Surgical History:  Procedure Laterality Date  . ABDOMINAL HYSTERECTOMY    . ESOPHAGOGASTRODUODENOSCOPY (EGD) WITH PROPOFOL N/A 03/08/2016   Procedure: ESOPHAGOGASTRODUODENOSCOPY (EGD) WITH PROPOFOL;  Surgeon: Josefine Class, MD;  Location: [facility] ENDOSCOPY;  Service: Endoscopy;  Laterality: N/A;  . SURGERY OF LIP    . teeth pulled      Social History   Socioeconomic History  . Marital status: Single    Spouse name: Not on file  . Number of children: Not on file  . Years of education: Not on file  . Highest education level: Not on file  Occupational History  . Not on file  Tobacco Use  . Smoking status: Former Research scientist (life sciences)  . Smokeless tobacco: Never Used    Substance and Sexual Activity  . Alcohol use: No    Alcohol/week: 0.0 standard drinks  . Drug use: No  . Sexual activity: Not on file  Other Topics Concern  . Not on file  Social History Narrative  . Not on file   Social Determinants of Health   Financial Resource Strain:   . Difficulty of Paying Living Expenses:   Food Insecurity:   . Worried About Charity fundraiser in the Last Year:   . Arboriculturist in the Last Year:   Transportation Needs:   . Film/video editor (Medical):   Marland Kitchen Lack of Transportation (Non-Medical):   Physical Activity:   . Days of Exercise per Week:   . Minutes of Exercise per Session:   Stress:   . Feeling of Stress :   Social Connections:   . Frequency of Communication with Friends and Family:   . Frequency of Social Gatherings with Friends and Family:   . Attends Religious Services:   . Active Member of Clubs or Organizations:   . Attends Archivist Meetings:   Marland Kitchen Marital Status:   Intimate Partner Violence:   . Fear of Current or Ex-Partner:   . Emotionally Abused:   Marland Kitchen Physically  Abused:   . Sexually Abused:     Family History  Problem Relation Age of Onset  . Coronary artery disease Mother   . Coronary artery disease Father   . Stroke Father   . Stroke Sister   . Stroke Daughter   . Anxiety disorder Daughter   . Depression Daughter   . Skin cancer Daughter     Allergies  Allergen Reactions  . Beta Adrenergic Blockers Other (See Comments)    unknown  . Sulfa Antibiotics Other (See Comments)    Mouth sores  . Xalatan [Latanoprost] Other (See Comments)    unknown     Review of Systems   Review of Systems: Negative Unless Checked Constitutional: [] Weight loss  [] Fever  [] Chills Cardiac: [] Chest pain   []  Atrial Fibrillation  [] Palpitations   [] Shortness of breath when laying flat   [] Shortness of breath with exertion. [] Shortness of breath at rest Vascular:  [] Pain in legs with walking   [] Pain in legs with  standing [] Pain in legs when laying flat   [] Claudication    [] Pain in feet when laying flat    [] History of DVT   [] Phlebitis   [x] Swelling in legs   [x] Varicose veins   [] Non-healing ulcers Pulmonary:   [] Uses home oxygen   [] Productive cough   [] Hemoptysis   [] Wheeze  [x] COPD   [] Asthma Neurologic:  [] Dizziness   [] Seizures  [] Blackouts [] History of stroke   [] History of TIA  [] Aphasia   [] Temporary Blindness   [] Weakness or numbness in arm   [] Weakness or numbness in leg Musculoskeletal:   [] Joint swelling   [] Joint pain   [] Low back pain  []  History of Knee Replacement [x] Arthritis [] back Surgeries  []  Spinal Stenosis    Hematologic:  [] Easy bruising  [] Easy bleeding   [] Hypercoagulable state   [] Anemic Gastrointestinal:  [] Diarrhea   [] Vomiting  [x] Gastroesophageal reflux/heartburn   [] Difficulty swallowing. [] Abdominal pain Genitourinary:  [] Chronic kidney disease   [] Difficult urination  [] Anuric   [] Blood in urine [] Frequent urination  [] Burning with urination   [] Hematuria Skin:  [] Rashes   [] Ulcers [] Wounds Psychological:  [] History of anxiety   []  History of major depression  [x]  Memory Difficulties      OBJECTIVE:   Physical Exam  BP 123/66 (BP Location: Left Arm)   Pulse 71   Resp 16   Ht 4\' 11"  (1.499 m)   Wt 107 lb (48.5 kg)   BMI 21.61 kg/m   Gen: WD/WN, NAD Head: [place]/AT, No temporalis wasting.  Ear/Nose/Throat: Hearing grossly intact, nares w/o erythema or drainage Eyes: PER, EOMI, sclera nonicteric.  Neck: Supple, no masses.  No JVD.  Pulmonary:  Good air movement, no use of accessory muscles.  Cardiac: RRR Vascular:  3+ pitting edema bilaterally Vessel Right Left  Radial Palpable Palpable  Dorsalis Pedis Palpable Palpable  Posterior Tibial Palpable Palpable   Gastrointestinal: soft, non-distended. No guarding/no peritoneal signs.  Musculoskeletal: M/S 5/5 throughout.  No deformity or atrophy.  Neurologic: Pain and light touch intact in extremities.   Symmetrical.  Speech is fluent. Motor exam as listed above. Psychiatric: Judgment intact, Mood & affect appropriate for pt's clinical situation. Dermatologic: No Venous rashes. No Ulcers Noted.  No changes consistent with cellulitis. Lymph : No Cervical lymphadenopathy, no lichenification or skin changes of chronic lymphedema.       ASSESSMENT AND PLAN:  1. Lymphedema I had a long discussion with the patient regarding her lower extremity edema in addition to the benefits of conservative  therapy.  We discussed how utilizing medical grade 1 compression stockings is going to be essential for controlling her edema in addition to elevating her lower extremities as much as possible.  The patient states that she takes frequent breaks and she has been advised to elevate her legs whenever she takes a break.  The patient will be moving to [place] in 2 weeks.  The patient presents we will place her back in [address] wraps for her to remove in 7 days.  Then she will transition to medical grade 1 compression stockings.  Patient states that she needs a new pair and she will obtain these during the week that she has wraps.  This is also to ensure that she will have blisters or ulceration before she is able to get compression stockings.  Otherwise, the patient will follow-up with [place] on an as-needed basis.  2. Mild cognitive impairment This makes management of her lower extremity edema somewhat difficult due to the fact that the patient is not always compliant with prescribed treatment.  Patient did not have family member present today.   Current Outpatient Medications on File Prior to Visit  Medication Sig Dispense Refill  . albuterol (VENTOLIN HFA) 108 (90 BASE) MCG/ACT inhaler Inhale 108 mcg into the lungs every 6 (six) hours as needed for wheezing or shortness of breath.     . brimonidine (ALPHAGAN) 0.2 % ophthalmic solution Place 1 drop into both eyes daily.    . Brinzolamide-Brimonidine (SIMBRINZA) 1-0.2 % SUSP  instill 1 drop into right eye twice a day    . carboxymethylcellulose (REFRESH PLUS) 0.5 % SOLN Apply 1-2 drops to eye daily as needed (dry eyes).     . cetirizine (ZYRTEC) 10 MG tablet Take 10 mg by mouth daily.    . DHA-EPA-Flaxseed Oil-Vitamin E (THERATEARS NUTRITION) CAPS Take by mouth.    . gabapentin (NEURONTIN) 100 MG capsule Take 100-200 mg by mouth at bedtime.    . Ipratropium-Albuterol (COMBIVENT RESPIMAT) 20-100 MCG/ACT AERS respimat Inhale 1 puff into the lungs every 6 (six) hours. 1 Inhaler 1  . lisinopril (PRINIVIL,ZESTRIL) 5 MG tablet Take 5 mg by mouth daily.  2  . montelukast (SINGULAIR) 10 MG tablet Take 10 mg by mouth at bedtime.    . naproxen sodium (ALEVE) 220 MG tablet Take by mouth.    . oxybutynin (DITROPAN) 5 MG tablet Take 5 mg by mouth 3 (three) times daily between meals as needed (overactive bladder).     . pantoprazole (PROTONIX) 40 MG tablet Take 40 mg by mouth daily.    Marland Kitchen PROCTOSOL HC 2.5 % rectal cream Place 1 application rectally 3 (three) times daily as needed for itching.  1  . chlorthalidone (HYGROTON) 25 MG tablet Take by mouth.    . clotrimazole (MYCELEX) 10 MG troche Take 1 tablet (10 mg total) by mouth 5 (five) times daily. (Patient not taking: Reported on 01/01/2020) 70 tablet 0  . furosemide (LASIX) 20 MG tablet Take 0.5 tablets (10 mg total) by mouth daily. 15 tablet 1  . furosemide (LASIX) 20 MG tablet Take 0.5 tablets (10 mg total) by mouth daily. 14 tablet 0   No current facility-administered medications on file prior to visit.    There are no Patient Instructions on file for this visit. No follow-ups on file.   Kris Hartmann, NP  This note was completed with Sales executive.  Any errors are purely unintentional.

## 2020-02-18 ENCOUNTER — Encounter (INDEPENDENT_AMBULATORY_CARE_PROVIDER_SITE_OTHER): Payer: Self-pay | Admitting: Nurse Practitioner

## 2020-02-18 NOTE — Progress Notes (Signed)
History of Present Illness  There is no documented history at this time  Assessments & Plan   There are no diagnoses linked to this encounter.    Additional instructions  Subjective:  Patient presents with venous ulcer of the Bilateral lower extremity.    Procedure:  3 layer unna wrap was placed Bilateral lower extremity.   Plan:   Follow up in one week.  

## 2020-04-22 DEATH — deceased

## 2021-03-18 IMAGING — RF DG ESOPHAGUS
5 series · 14 of 14 positions shown · non-contrast
Comparison: None.

CLINICAL DATA: Constipation, esophagitis

EXAM:
ESOPHOGRAM / BARIUM SWALLOW / BARIUM TABLET STUDY
TECHNIQUE: Combined double contrast and single contrast examination performed
using effervescent crystals, thick barium liquid, and thin barium
liquid. The patient was observed with fluoroscopy swallowing a 13 mm
barium sulphate tablet.
FLUOROSCOPY TIME:  Fluoroscopy Time:  0.7 minute
Radiation Exposure Index (if provided by the fluoroscopic device):
1.5 mGy
Number of Acquired Spot Images: 0

[Series 1: cp_standard · 0.25mm/px · 4 of 63 frames shown (1 of 5)]
[frame 10/63]
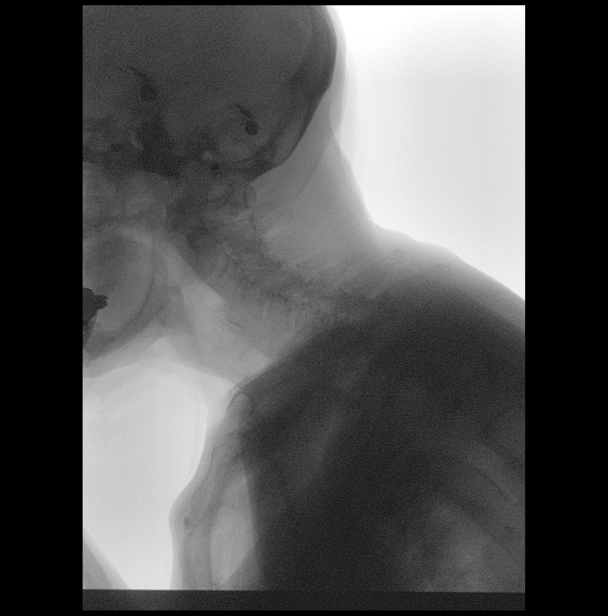
[frame 32/63]
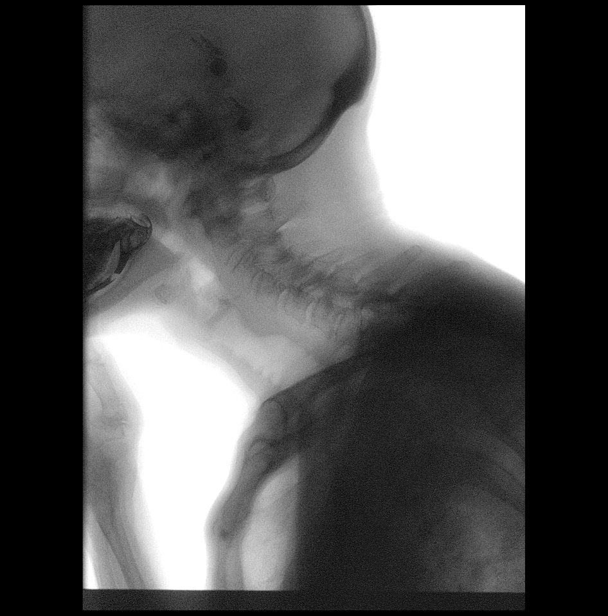
[frame 52/63]
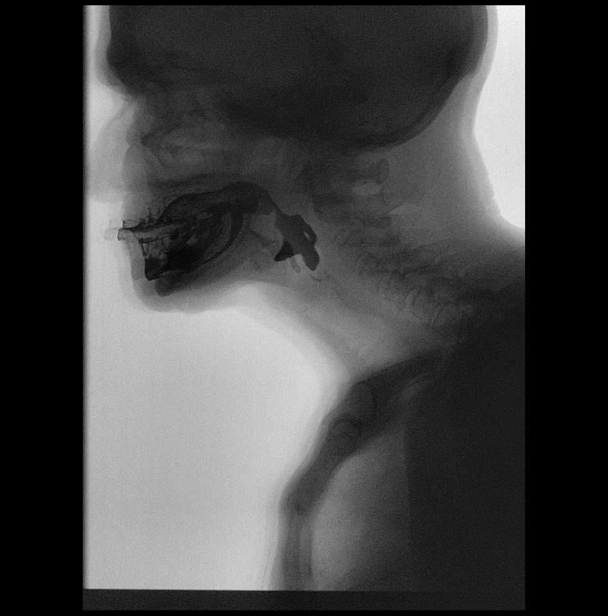
[frame 54/63]
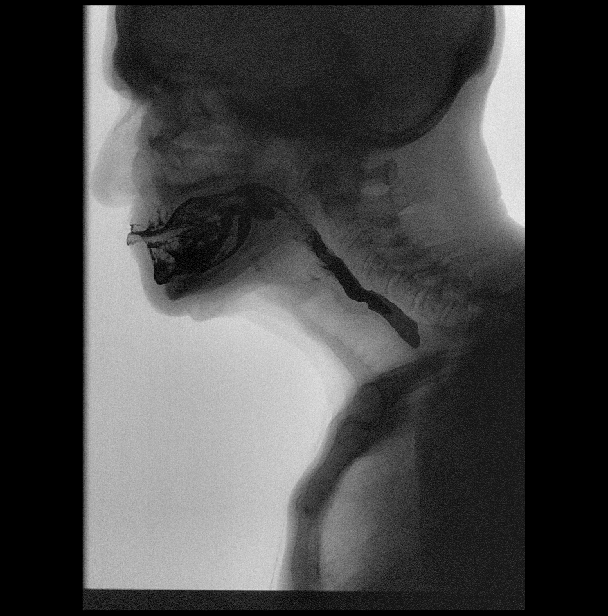

[Series 2: cp_standard · 0.25mm/px · 4 of 19 frames shown (2 of 5)]
[frame 1/19]
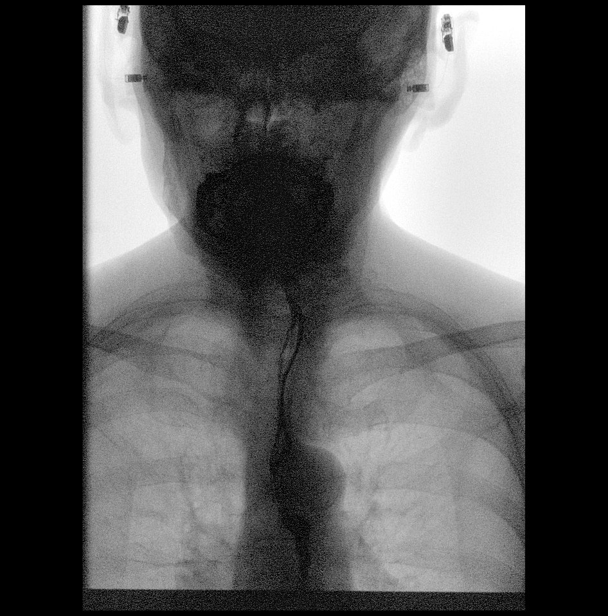
[frame 3/19]
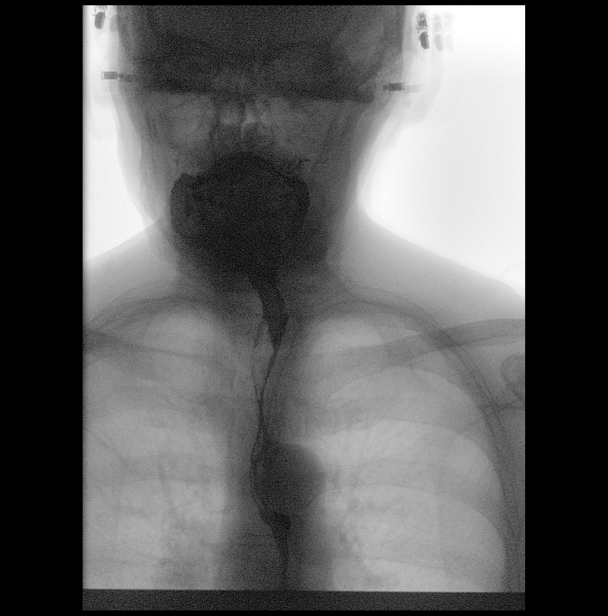
[frame 10/19]
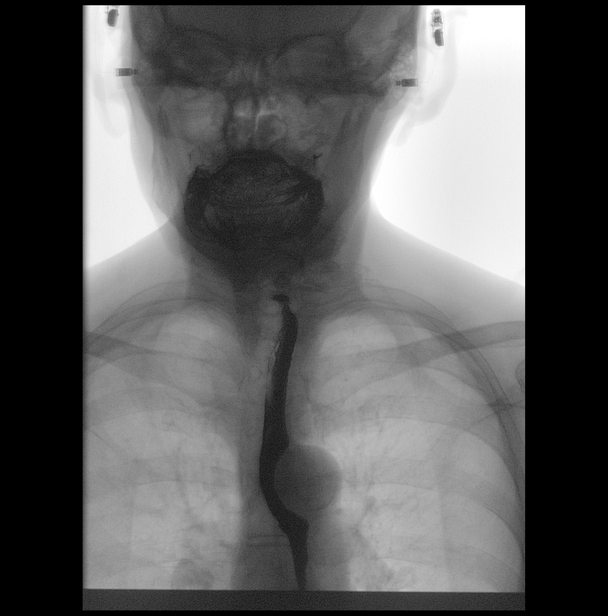
[frame 17/19]
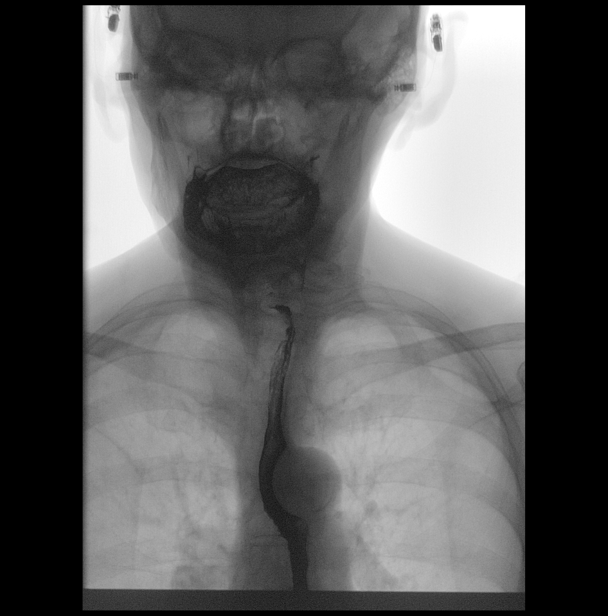

[Series 3: cp_standard · 0.25mm/px · 1 of 1 slices shown (3 of 5)]
[im 1/1]
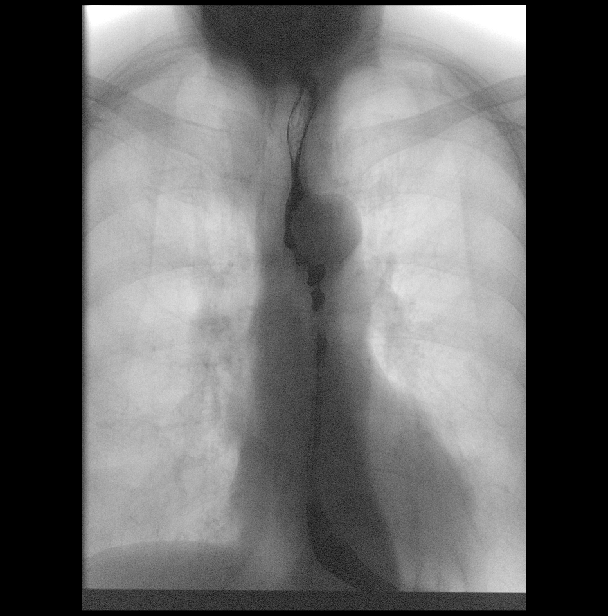

[Series 4: cp_standard · 0.25mm/px · 4 of 35 frames shown (4 of 5)]
[frame 6/35]
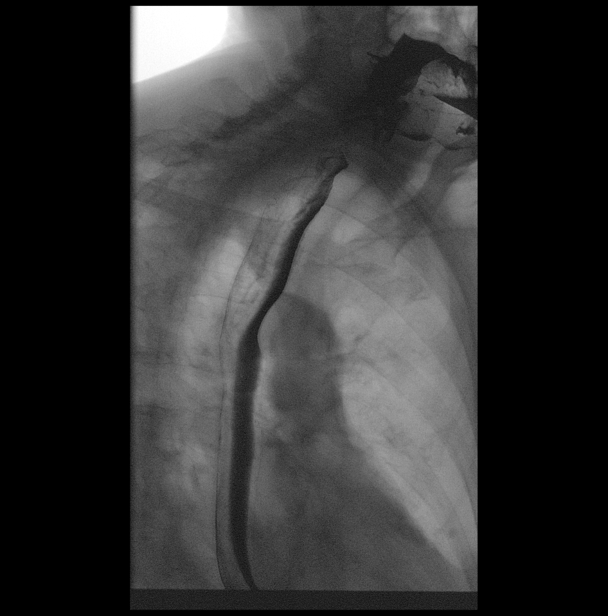
[frame 18/35]
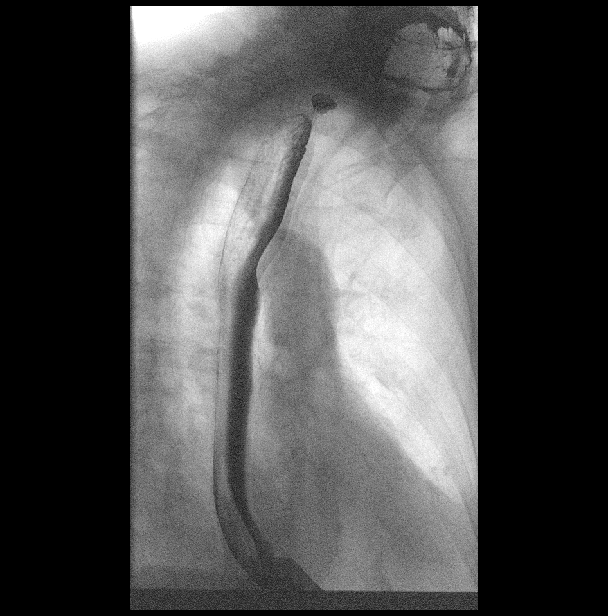
[frame 19/35]
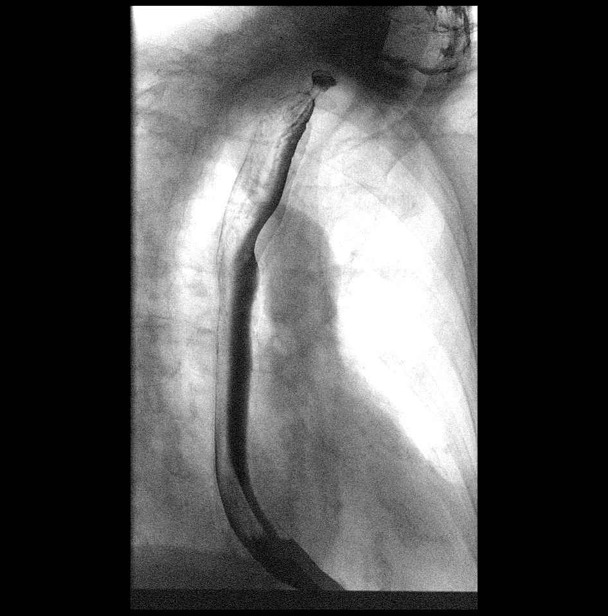
[frame 30/35]
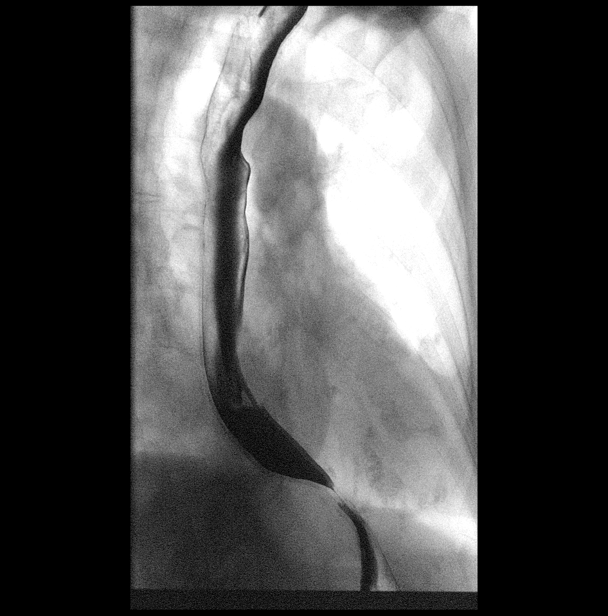

[Series 5: cp_standard · 0.25mm/px · 1 of 1 slices shown (5 of 5)]
[im 1/1]
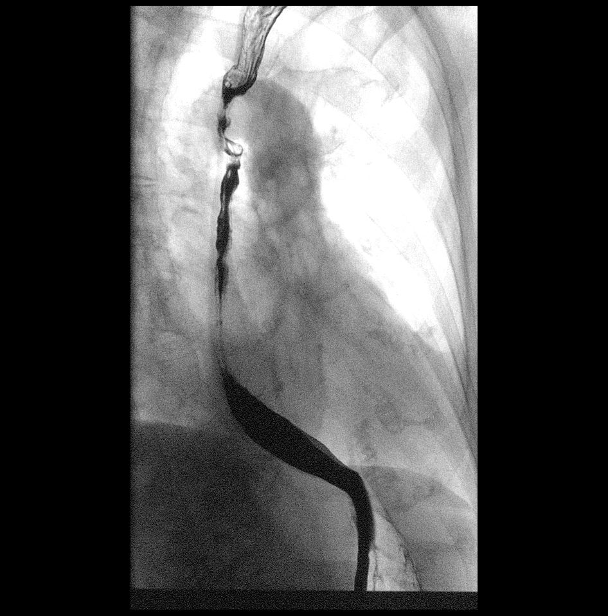

[14 of 14 positions shown; findings below may reference images not displayed]

FINDINGS: Normal pharyngeal anatomy and motility. Contrast flowed freely
through the esophagus without evidence of a stricture or mass.
Normal esophageal mucosa without evidence of irregularity or
ulceration. Esophageal motility was normal. No evidence of reflux.
No definite hiatal hernia was demonstrated.

At the end of the examination a 13 mm barium tablet was administered
which transited through the esophagus and esophagogastric junction
without delay.
IMPRESSION: 1. Normal barium swallow.
